# Patient Record
Sex: Male | Born: 1965
Health system: Southern US, Community
[De-identification: ages and names within clinical notes are randomized; demographics above are authoritative.]

## PROBLEM LIST (undated history)

## (undated) DIAGNOSIS — E785 Hyperlipidemia, unspecified: Secondary | ICD-10-CM

## (undated) HISTORY — DX: Hyperlipidemia, unspecified: E78.5

## (undated) HISTORY — PX: HERNIA REPAIR: SHX51

## (undated) HISTORY — PX: OTHER SURGICAL HISTORY: SHX169

## (undated) HISTORY — PX: COLONOSCOPY: SHX174

---

## 2005-05-01 ENCOUNTER — Ambulatory Visit: Payer: Self-pay | Admitting: Cardiology

## 2005-05-23 ENCOUNTER — Ambulatory Visit (HOSPITAL_COMMUNITY): Admission: RE | Admit: 2005-05-23 | Discharge: 2005-05-23 | Payer: Self-pay | Admitting: Gastroenterology

## 2008-05-17 ENCOUNTER — Ambulatory Visit (HOSPITAL_BASED_OUTPATIENT_CLINIC_OR_DEPARTMENT_OTHER): Admission: RE | Admit: 2008-05-17 | Discharge: 2008-05-17 | Payer: Self-pay | Admitting: General Surgery

## 2010-11-26 NOTE — Op Note (Signed)
NAME:  [name], [name]              ACCOUNT NO.:  [id number]   MEDICAL RECORD NO.:  [id number]          PATIENT TYPE:  AMB   LOCATION:  DSC                          FACILITY:  [facility]   PHYSICIAN:  [name], MDDATE OF BIRTH:  22-Aug-1965   DATE OF PROCEDURE:  05/17/2008  DATE OF DISCHARGE:                               OPERATIVE REPORT   PREOPERATIVE DIAGNOSIS:  Left inguinal hernia.   POSTOPERATIVE DIAGNOSIS:  Pantaloon left inguinal hernia.   PROCEDURE:  Left inguinal hernia repair with large Prolene Hernia System  mesh.   SURGEON:  Lucas Gosling, MD.   ASSISTANT:  None.   ANESTHESIA:  General.   FINDINGS:  Pantaloon left inguinal hernia.   SPECIMENS:  None.   DRAIN:  None.   COMPLICATIONS:  None.   ESTIMATED BLOOD LOSS:  Minimal.   DISPOSITION:  To PACU in stable condition.   HISTORY:  [name] is a 45 year old male who with a several year history of  a left groin bulge that had been increasingly enlarging in size and  becoming symptomatic.  Over the last several months, it began difficult  for him to reduce this as well.  On exam, he has a reducible left groin  hernia.  He has a prior history of right orchiectomy for a benign  testicular nodule.   PROCEDURE:  After informed consent was obtained, the patient was taken  to the operating room.  He was administered 1 g of intravenous Ancef.  Sequential compression devices were placed on the lower extremities,  underwent  general anesthesia without complication.  His left groin was  then prepped and draped in the standard sterile surgical fashion.  Surgical time-out was then performed.  A 1.5-cm left groin incision was  then made.  Dissection was carried out down to the level of the external  abdominal oblique.  The superficial epigastric vein was identified, and  this was ligated with 3-0 Vicryl suture.  The external abdominal oblique  was then entered through the external ring.  The spermatic cord was  then  encircled with a Penrose drain, preserving this throughout the entire  procedure.  He was noted to have a large chronic left indirect inguinal  hernia sac, which was dissected free from the spermatic cord structures.  He was also noted to have a direct hernia, and this was dissected free  from the surrounding tissues as well as from the epigastric vessels.  The direct hernia sac  was excised and tied off with a silk suture.  The  entire hernia was then reduced.  The preperitoneal space was then  developed with a sponge.  A large Prolene Hernia System mesh was then  inserted into the space overlying Coopers ligament.  The bottom portion  of the bilayer was completely laid out.  I then closed the floor with 2-  0 Vicryl over this.  The top portion of the bilayer was then laid flat  laterally.  Underneath the external abdominal oblique, a T-cut was made  and wrapped around the spermatic cord.  This was sutured in position  near the pubic  tubercle, sutured into the internal oblique superiorly,  sutured medially to the inguinal ligament and then the cut portions of  the mesh were wrapped around the spermatic cord, and then tacked  together as well as to the inguinal ligament with a 2-0 Prolene suture.  The mesh appeared to be  lying good position upon completion of this.  Hemostasis was observed.  The external abdominal oblique was closed with  a 2-0 Vicryl, Scarpa with a 3-0 Vicryl, and the skin was closed with 4-0  in a subcuticular suture.  Dermabond was then placed over his incision.  I then infiltrated 10 mL of 0.25% Marcaine around his wound as well as  performed an ilioinguinal nerve block.  Upon completion, he was  extubated in the operating room and transferred to the recovery room in  stable condition.      Lucas Gosling, MD  Electronically Signed     [facility]/MEDQ  D:  05/17/2008  T:  05/18/2008  Job:  [id number]

## 2010-11-29 NOTE — Op Note (Signed)
NAME:  [name], [name]              ACCOUNT NO.:  [id number]   MEDICAL RECORD NO.:  [id number]          PATIENT TYPE:  AMB   LOCATION:  ENDO                         FACILITY:  [facility]   PHYSICIAN:  John C. Madilyn Fireman, M.D.    DATE OF BIRTH:  1966/01/13   DATE OF PROCEDURE:  05/23/2005  DATE OF DISCHARGE:                                 OPERATIVE REPORT   OPERATION:  Colonoscopy.   INDICATIONS FOR PROCEDURE:  Heme positive stool.   PROCEDURE:  The patient was placed in the left lateral decubitus position  and placed on the pulse monitor with continuous low flow oxygen delivered by  nasal cannula.  He was sedated with 75 mcg IV Fentanyl and 7.5 mg IV Versed.  The Olympus video colonoscope was inserted into the rectum and advanced to  the cecum, confirmed by transillumination of McBurney's point and  visualization of the ileocecal valve and appendiceal orifice.  The prep was  good.  The cecum, ascending, transverse, descending, sigmoid, and rectum all  appeared normal with no masses, polyps, diverticula or other mucosal  abnormalities.  The rectum, likewise, appeared normal.  Retroflex view of  the anus revealed no obvious internal hemorrhoids.  The scope was then  withdrawn and the patient returned to the recovery room in stable condition.  He tolerated the procedure well and there were no immediate complications.   IMPRESSION:  Normal colonoscopy.   PLAN:  Repeat colonoscopy at age 45.           ______________________________  Everardo All. Madilyn Fireman, M.D.     [facility]/MEDQ  D:  05/23/2005  T:  05/23/2005  Job:  [id number]   cc:   Ernestina Penna, M.D.  Fax: [phone]

## 2011-04-15 LAB — CBC
Hemoglobin: 16.1
RBC: 5.1
WBC: 5

## 2011-04-15 LAB — DIFFERENTIAL
Lymphocytes Relative: 43
Lymphs Abs: 2.2
Monocytes Absolute: 0.4
Monocytes Relative: 9
Neutro Abs: 2.2
Neutrophils Relative %: 44

## 2011-07-17 ENCOUNTER — Encounter: Payer: Self-pay | Admitting: Emergency Medicine

## 2011-07-17 ENCOUNTER — Emergency Department (INDEPENDENT_AMBULATORY_CARE_PROVIDER_SITE_OTHER)
Admission: EM | Admit: 2011-07-17 | Discharge: 2011-07-17 | Disposition: A | Discharge status: Home / Self Care | Payer: Self-pay | Source: Home / Self Care | Attending: Family Medicine | Admitting: Family Medicine

## 2011-07-17 DIAGNOSIS — S139XXA Sprain of joints and ligaments of unspecified parts of neck, initial encounter: Secondary | ICD-10-CM

## 2011-07-17 DIAGNOSIS — S161XXA Strain of muscle, fascia and tendon at neck level, initial encounter: Secondary | ICD-10-CM

## 2011-07-17 DIAGNOSIS — M549 Dorsalgia, unspecified: Secondary | ICD-10-CM

## 2011-07-17 MED ORDER — CYCLOBENZAPRINE HCL 5 MG PO TABS: 5.0000 mg | ORAL_TABLET | Freq: Three times a day (TID) | ORAL | Status: AC | PRN
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
## 2011-11-18 ENCOUNTER — Other Ambulatory Visit: Payer: Self-pay | Admitting: Family Medicine

## 2011-11-18 DIAGNOSIS — M549 Dorsalgia, unspecified: Secondary | ICD-10-CM

## 2011-11-18 DIAGNOSIS — M542 Cervicalgia: Secondary | ICD-10-CM

## 2011-11-21 ENCOUNTER — Other Ambulatory Visit (HOSPITAL_COMMUNITY): Payer: PRIVATE HEALTH INSURANCE

## 2011-12-03 ENCOUNTER — Ambulatory Visit (INDEPENDENT_AMBULATORY_CARE_PROVIDER_SITE_OTHER): Payer: 59 | Admitting: Sports Medicine

## 2011-12-03 ENCOUNTER — Encounter: Payer: Self-pay | Admitting: Sports Medicine

## 2011-12-03 VITALS — BP 160/98 | HR 67 | Ht 69.0 in | Wt 170.0 lb

## 2011-12-03 DIAGNOSIS — M7741 Metatarsalgia, right foot: Secondary | ICD-10-CM | POA: Insufficient documentation

## 2011-12-03 DIAGNOSIS — M7750 Other enthesopathy of unspecified foot: Secondary | ICD-10-CM

## 2011-12-03 NOTE — Assessment & Plan Note (Addendum)
Plan to have patient wear inserts in his shoes. Will start with green insole with some hapad pads for the metatarsal area  Try different size MT pads and if one works well we will continue with this  Reck BP at PCP offcie

## 2011-12-03 NOTE — Progress Notes (Signed)
Patient ID: [name], male   DOB: 08-30-1965, 46 y.o.   MRN: [id number] History of present illness: Patient presents today for evaluation of right foot pain has been going on for 2 years. He says that 2 years ago he changed running shoes from ascics to nikes. He noted pain soon after that but kept running on the shoes. 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 He has not been able to resolve and on its own and he is worried he is a stress fracture.  Exam- Foot-the right foot has a transverse arch collapse Loss of longitudinal arch Forefoot widening Lowering of the navicular bone Fourth metatarsal has some tenderness and is lower Fifth metatarsal is subluxed bilaterally Bilateral bunionette present No swelling No TTP over dorsum of MT  Running gait without limp and neg hop

## 2012-05-21 ENCOUNTER — Ambulatory Visit: Payer: 59

## 2012-06-18 ENCOUNTER — Ambulatory Visit (HOSPITAL_COMMUNITY)
Admission: RE | Admit: 2012-06-18 | Discharge: 2012-06-18 | Disposition: A | Discharge status: Home / Self Care | Payer: No Typology Code available for payment source | Source: Ambulatory Visit | Attending: Family Medicine | Admitting: Family Medicine

## 2012-06-18 DIAGNOSIS — M542 Cervicalgia: Secondary | ICD-10-CM

## 2012-06-18 DIAGNOSIS — M549 Dorsalgia, unspecified: Secondary | ICD-10-CM

## 2012-06-18 DIAGNOSIS — M47812 Spondylosis without myelopathy or radiculopathy, cervical region: Secondary | ICD-10-CM | POA: Insufficient documentation

## 2012-07-09 ENCOUNTER — Ambulatory Visit: Payer: 59 | Attending: Family Medicine

## 2012-07-09 DIAGNOSIS — M2569 Stiffness of other specified joint, not elsewhere classified: Secondary | ICD-10-CM | POA: Insufficient documentation

## 2012-07-09 DIAGNOSIS — M542 Cervicalgia: Secondary | ICD-10-CM | POA: Insufficient documentation

## 2012-07-09 DIAGNOSIS — IMO0001 Reserved for inherently not codable concepts without codable children: Secondary | ICD-10-CM | POA: Insufficient documentation

## 2012-07-09 DIAGNOSIS — M545 Low back pain, unspecified: Secondary | ICD-10-CM | POA: Insufficient documentation

## 2012-07-09 DIAGNOSIS — R5381 Other malaise: Secondary | ICD-10-CM | POA: Insufficient documentation

## 2012-07-09 DIAGNOSIS — M79609 Pain in unspecified limb: Secondary | ICD-10-CM | POA: Insufficient documentation

## 2012-07-12 ENCOUNTER — Ambulatory Visit: Payer: 59 | Admitting: Physical Therapy

## 2012-07-20 ENCOUNTER — Ambulatory Visit: Payer: No Typology Code available for payment source | Attending: Family Medicine | Admitting: Physical Therapy

## 2012-07-20 DIAGNOSIS — M545 Low back pain, unspecified: Secondary | ICD-10-CM | POA: Insufficient documentation

## 2012-07-20 DIAGNOSIS — M2569 Stiffness of other specified joint, not elsewhere classified: Secondary | ICD-10-CM | POA: Insufficient documentation

## 2012-07-20 DIAGNOSIS — IMO0001 Reserved for inherently not codable concepts without codable children: Secondary | ICD-10-CM | POA: Insufficient documentation

## 2012-07-20 DIAGNOSIS — M79609 Pain in unspecified limb: Secondary | ICD-10-CM | POA: Insufficient documentation

## 2012-07-20 DIAGNOSIS — M542 Cervicalgia: Secondary | ICD-10-CM | POA: Insufficient documentation

## 2012-07-20 DIAGNOSIS — R5381 Other malaise: Secondary | ICD-10-CM | POA: Insufficient documentation

## 2012-07-29 ENCOUNTER — Ambulatory Visit: Payer: No Typology Code available for payment source | Admitting: Physical Therapy

## 2012-08-04 ENCOUNTER — Ambulatory Visit: Payer: No Typology Code available for payment source | Admitting: Physical Therapy

## 2012-09-13 ENCOUNTER — Encounter: Payer: 59 | Admitting: Physical Therapy

## 2012-12-09 ENCOUNTER — Encounter: Payer: Self-pay | Admitting: Family Medicine

## 2012-12-09 ENCOUNTER — Other Ambulatory Visit (INDEPENDENT_AMBULATORY_CARE_PROVIDER_SITE_OTHER): Payer: 59

## 2012-12-09 ENCOUNTER — Other Ambulatory Visit: Payer: Self-pay | Admitting: *Deleted

## 2012-12-09 DIAGNOSIS — Z139 Encounter for screening, unspecified: Secondary | ICD-10-CM

## 2012-12-09 DIAGNOSIS — E785 Hyperlipidemia, unspecified: Secondary | ICD-10-CM

## 2012-12-09 DIAGNOSIS — R002 Palpitations: Secondary | ICD-10-CM

## 2012-12-09 LAB — HEPATIC FUNCTION PANEL
AST: 29 U/L (ref 0–37)
Albumin: 4.8 g/dL (ref 3.5–5.2)
Alkaline Phosphatase: 65 U/L (ref 39–117)
Indirect Bilirubin: 0.4 mg/dL (ref 0.0–0.9)
Total Protein: 7 g/dL (ref 6.0–8.3)

## 2012-12-09 LAB — POCT CBC
HCT, POC: 46.1 % (ref 43.5–53.7)
Hemoglobin: 16.1 g/dL (ref 14.1–18.1)
MCH, POC: 32 pg — AB (ref 27–31.2)
MCHC: 34.9 g/dL (ref 31.8–35.4)
POC Granulocyte: 4.3 (ref 2–6.9)
RDW, POC: 13.2 %
WBC: 6.6 10*3/uL (ref 4.6–10.2)

## 2012-12-09 NOTE — Patient Instructions (Signed)
Arrange stress test with cardiologist Routine labs today

## 2012-12-09 NOTE — Progress Notes (Signed)
  Subjective:    Patient ID: [name], male    DOB: 1965/07/22, 47 y.o.   MRN: [id number]  HPI The office manager at the rehabilitation center called and indicated that Lucas was having some chest discomfort and irregular heart. I went over to see him because he refused to come to our office. He indicated that he had been under a lot of stress recently and that his heart had been beating irregularly. He said that this was not anything new. This did happen at times in the past.      Review of Systems  Constitutional: Negative.   Cardiovascular:       Chest discomfort and palpitation         Objective:   Physical Exam  Constitutional: He is oriented to person, place, and time. He appears well-developed and well-nourished. No distress.  HENT:  Head: Normocephalic.  Eyes: Conjunctivae are normal. Right eye exhibits no discharge. Left eye exhibits no discharge.  Neck: Normal range of motion. Neck supple. No thyromegaly present.  Cardiovascular: Normal rate and normal heart sounds.  Exam reveals no gallop and no friction rub.   No murmur heard. Irregular rhythm with frequent PVC at about 72 per minute  Pulmonary/Chest: Effort normal and breath sounds normal. He has no wheezes. He has no rales.  Abdominal: Soft. He exhibits no mass. There is no tenderness. There is no rebound and no guarding.  Musculoskeletal: Normal range of motion.  Lymphadenopathy:    He has no cervical adenopathy.  Neurological: He is alert and oriented to person, place, and time.  Skin: Skin is warm and dry.  Psychiatric: He has a normal mood and affect. His behavior is normal. Judgment and thought content normal.    EKG was done, which showed some peaked T waves in V1 through V4      Assessment & Plan:    PVCs on auscultation, EKG sinus rhythm    Plan: Schedule stress test with Dr. Antoine Poche          Get routine labs with thyroid panel          Decrease caffeine intake

## 2012-12-09 NOTE — Addendum Note (Signed)
Addended by: Bearl Mulberry on: 12/09/2012 05:48 PM   Modules accepted: Orders

## 2012-12-10 LAB — THYROID PANEL WITH TSH
Free Thyroxine Index: 2.9 (ref 1.0–3.9)
T4, Total: 8.1 ug/dL (ref 5.0–12.5)
TSH: 0.979 u[IU]/mL (ref 0.350–4.500)

## 2012-12-10 LAB — BASIC METABOLIC PANEL WITH GFR
BUN: 12 mg/dL (ref 6–23)
CO2: 27 mEq/L (ref 19–32)
Chloride: 101 mEq/L (ref 96–112)
Creat: 1.08 mg/dL (ref 0.50–1.35)
Potassium: 4.4 mEq/L (ref 3.5–5.3)

## 2012-12-10 LAB — NMR LIPOPROFILE WITH LIPIDS
HDL Particle Number: 40.3 umol/L (ref >=30.5)
HDL Size: 9.4 nm (ref >=9.2)
HDL-C: 71 mg/dL (ref >=40)
LDL Size: 21.1 nm (ref >20.5)
Large HDL-P: 10 umol/L (ref >=4.8)
Large VLDL-P: 0.9 nmol/L (ref <=2.7)
Triglycerides: 69 mg/dL (ref <150)

## 2012-12-10 LAB — PSA: PSA: 0.92 ng/mL (ref <=4.00)

## 2012-12-15 ENCOUNTER — Telehealth: Payer: Self-pay | Admitting: Cardiology

## 2012-12-15 NOTE — Telephone Encounter (Signed)
[facility] attempted to call office number but no answer. Called pt on cell phone

## 2012-12-15 NOTE — Telephone Encounter (Signed)
New Prob     Pt calling to returning Dr. Lindaann Slough call from Monday. Please call.

## 2012-12-15 NOTE — Telephone Encounter (Signed)
Dr Hochrein spoke with pt 

## 2012-12-15 NOTE — Telephone Encounter (Signed)
Will have MD call

## 2012-12-16 ENCOUNTER — Telehealth: Payer: Self-pay | Admitting: *Deleted

## 2012-12-16 NOTE — Telephone Encounter (Signed)
Pt notified of lab results

## 2012-12-16 NOTE — Telephone Encounter (Signed)
Message copied by Bearl Mulberry on Thu Dec 16, 2012  4:43 PM ------      Message from: Ernestina Penna      Created: Sat Dec 11, 2012 12:48 AM       The BMP had a normal electrolytes and renal function tests. The blood sugar was elevated at 114. He was not however fasting when this was done.      All liver function tests were within normal limits.      The total LDL particle number on advanced lipid testing was elevated at 1507, this will need to be compared to his previous readings this past fall++++++++, the triglycerides were good. The HDL P. was excellent. LDL size was large. Treatment will be determined after reviewing previous advanced lipid panel testing from paper chart. Please compare this.       PSA is low and good.       thyroid is within normal limits appear ------

## 2012-12-24 ENCOUNTER — Encounter: Payer: Self-pay | Admitting: Family Medicine

## 2013-01-05 ENCOUNTER — Ambulatory Visit (INDEPENDENT_AMBULATORY_CARE_PROVIDER_SITE_OTHER): Payer: 59 | Admitting: Cardiology

## 2013-01-05 ENCOUNTER — Encounter: Payer: Self-pay | Admitting: Cardiology

## 2013-01-05 VITALS — BP 140/86 | HR 77 | Ht 69.0 in | Wt 173.0 lb

## 2013-01-05 DIAGNOSIS — R002 Palpitations: Secondary | ICD-10-CM

## 2013-01-05 NOTE — Progress Notes (Signed)
HPI [name] presents for evaluation of palpitations. He is having these in the past. He was having them for only a few weeks ago. However, he would probably under increased stress. He hasn't really taken any vacation time in quite a while. He was feeling palpitations that were occurring daily and sporadically but fairly persistent. He would feel it pounding in his chest. He would feel somewhat fatigued or lightheaded with this but did not have any syncope.  We talked about this over the phone. He subsequently went on vacation relieves some of his stress and started exercising again. He actually thinks that exercising with running "reset" his heart.  He has since going on vacation had no further tachypalpitations. He's not describing chest pressure, neck or arm discomfort. He's not having a new shortness of breath PND or orthopnea  No Known Allergies  Current Outpatient Prescriptions  Medication Sig Dispense Refill  . B Complex Vitamins (VITAMIN B COMPLEX PO) Take by mouth daily.      Marland Kitchen co-enzyme Q-10 30 MG capsule Take 30 mg by mouth daily.      . fish oil-omega-3 fatty acids 1000 MG capsule Take 1 g by mouth daily.      Marland Kitchen ibuprofen (ADVIL,MOTRIN) 200 MG tablet Take 200 mg by mouth every 6 (six) hours as needed.      . Multiple Vitamin (MULTIVITAMINS PO) Take by mouth daily.       No current facility-administered medications for this visit.    No past medical history on file.  Past Surgical History  Procedure Laterality Date  . Hernia repair      No family history on file.  History   Social History  . Marital Status: Married    Spouse Name: N/A    Number of Children: N/A  . Years of Education: N/A   Occupational History  . Not on file.   Social History Main Topics  . Smoking status: Never Smoker   . Smokeless tobacco: Never Used  . Alcohol Use: No  . Drug Use: No  . Sexually Active: Not on file   Other Topics Concern  . Not on file   Social History Narrative  . No  narrative on file    ROS:  Positive for urinary frequency, mild lumbar pain. Otherwise as stated in the history of present illness and negative for all systems are  PHYSICAL EXAM BP 140/86  Pulse 77  Ht 5\' 9"  (1.753 m)  Wt 173 lb (78.472 kg)  BMI 25.54 kg/m2 GENERAL:  Well appearing HEENT:  Pupils equal round and reactive, fundi not visualized, oral mucosa unremarkable NECK:  No jugular venous distention, waveform within normal limits, carotid upstroke brisk and symmetric, no bruits, no thyromegaly LYMPHATICS:  No cervical, inguinal adenopathy LUNGS:  Clear to auscultation bilaterally BACK:  No CVA tenderness CHEST:  Unremarkable HEART:  PMI not displaced or sustained,S1 and S2 within normal limits, no S3, no S4, no clicks, no rubs, no murmurs ABD:  Flat, positive bowel sounds normal in frequency in pitch, no bruits, no rebound, no guarding, no midline pulsatile mass, no hepatomegaly, no splenomegaly EXT:  2 plus pulses throughout, no edema, no cyanosis no clubbing SKIN:  No rashes no nodules NEURO:  Cranial nerves II through XII grossly intact, motor grossly intact throughout PSYCH:  Cognitively intact, oriented to person place and time   EKG:  Sinus rhythm, rate 67, axis within normal limits, intervals within normal limits, premature ectopic complexes, no acute ST-T wave changes.  ASSESSMENT AND PLAN  Palpitations:  These seem to have resolved. We talked about when necessary beta blocker dosing. However, he doesn't think he needs this. Certainly he does better with physical exercise. Therefore, at this point no further evaluation or change in therapy is indicated. We talked about her continued exercise regimen and this is certainly an area of expertise for him and he understands and will try to be more consistent.

## 2013-03-04 ENCOUNTER — Encounter: Payer: 59 | Admitting: Cardiology

## 2013-03-23 ENCOUNTER — Other Ambulatory Visit: Payer: Self-pay | Admitting: *Deleted

## 2013-03-23 ENCOUNTER — Ambulatory Visit (INDEPENDENT_AMBULATORY_CARE_PROVIDER_SITE_OTHER): Payer: 59 | Admitting: *Deleted

## 2013-03-23 DIAGNOSIS — R002 Palpitations: Secondary | ICD-10-CM

## 2013-03-23 NOTE — Progress Notes (Signed)
Patient ID: [name], male   DOB: Nov 14, 1965, 47 y.o.   MRN: [id number] HOLTER MONITOR PUT ON PT BY KRISTEN HUDY.

## 2013-03-24 ENCOUNTER — Other Ambulatory Visit: Payer: Self-pay | Admitting: *Deleted

## 2013-03-24 DIAGNOSIS — R002 Palpitations: Secondary | ICD-10-CM

## 2013-03-24 NOTE — Progress Notes (Signed)
24 hr holter monitor removed and downloaded and sent to [facility].

## 2013-03-30 NOTE — Progress Notes (Signed)
Heart monitor downloaded and results sent to [facility]  On 03/24/13. Results obtained on 9/16 and reviewed by dr Christell Constant. Results scanned into epic and sent to Dr. Antoine Poche for review. Copy of results given to patient and he will discuss his symptoms with Dr. Antoine Poche when he is at the [facility] office.

## 2014-01-19 ENCOUNTER — Telehealth: Payer: Self-pay | Admitting: Family Medicine

## 2014-01-19 MED ORDER — AZITHROMYCIN 250 MG PO TABS: ORAL_TABLET | ORAL | Status: DC

## 2014-01-19 NOTE — Telephone Encounter (Signed)
zpak sent to pharmacy 

## 2014-01-19 NOTE — Telephone Encounter (Signed)
He is still having congestion you said if it continued you would call in a zpack he uses [employer]

## 2014-07-05 ENCOUNTER — Other Ambulatory Visit: Payer: Self-pay | Admitting: Family Medicine

## 2014-07-05 ENCOUNTER — Ambulatory Visit: Payer: 59

## 2014-07-05 ENCOUNTER — Ambulatory Visit (INDEPENDENT_AMBULATORY_CARE_PROVIDER_SITE_OTHER): Payer: 59

## 2014-07-05 ENCOUNTER — Ambulatory Visit (INDEPENDENT_AMBULATORY_CARE_PROVIDER_SITE_OTHER): Payer: 59 | Admitting: Family Medicine

## 2014-07-05 ENCOUNTER — Encounter: Payer: Self-pay | Admitting: Family Medicine

## 2014-07-05 VITALS — BP 140/88 | HR 97 | Temp 97.4°F | Ht 69.0 in | Wt 166.0 lb

## 2014-07-05 DIAGNOSIS — R911 Solitary pulmonary nodule: Secondary | ICD-10-CM

## 2014-07-05 DIAGNOSIS — I499 Cardiac arrhythmia, unspecified: Secondary | ICD-10-CM

## 2014-07-05 DIAGNOSIS — R059 Cough, unspecified: Secondary | ICD-10-CM

## 2014-07-05 DIAGNOSIS — R05 Cough: Secondary | ICD-10-CM

## 2014-07-05 DIAGNOSIS — J209 Acute bronchitis, unspecified: Secondary | ICD-10-CM

## 2014-07-05 LAB — POCT CBC
Granulocyte percent: 83.5 %G — AB (ref 37–80)
HCT, POC: 46.3 % (ref 43.5–53.7)
Hemoglobin: 15.3 g/dL (ref 14.1–18.1)
LYMPH, POC: 1.1 (ref 0.6–3.4)
MCH: 30.7 pg (ref 27–31.2)
MCHC: 33 g/dL (ref 31.8–35.4)
MCV: 93.1 fL (ref 80–97)
MPV: 6.7 fL (ref 0–99.8)
PLATELET COUNT, POC: 239 10*3/uL (ref 142–424)
POC Granulocyte: 8.3 — AB (ref 2–6.9)
POC LYMPH %: 11.3 % (ref 10–50)
RBC: 5 M/uL (ref 4.69–6.13)
RDW, POC: 12.7 %
WBC: 9.9 10*3/uL (ref 4.6–10.2)

## 2014-07-05 MED ORDER — AZITHROMYCIN 250 MG PO TABS: ORAL_TABLET | ORAL | Status: DC

## 2014-07-05 NOTE — Progress Notes (Signed)
Subjective:    Patient ID: [name], male    DOB: 23-Feb-1966, 48 y.o.   MRN: [id number]  HPI Patient here today for chest tightness, cough and congestion that started several days ago. It is important to note that the patient's son does have pneumonia. The patient's blood pressure is also elevated today and we will request that he bring readings back for review and 4 weeks. We'll also request that he watches his sodium intake better.       Patient Active Problem List   Diagnosis Date Noted  . Palpitations 01/05/2013  . Metatarsalgia, right 12/03/2011   Outpatient Encounter Prescriptions as of 07/05/2014  Medication Sig  . B Complex Vitamins (VITAMIN B COMPLEX PO) Take by mouth daily.  Marland Kitchen co-enzyme Q-10 30 MG capsule Take 30 mg by mouth daily.  . fish oil-omega-3 fatty acids 1000 MG capsule Take 1 g by mouth daily.  Marland Kitchen ibuprofen (ADVIL,MOTRIN) 200 MG tablet Take 200 mg by mouth every 6 (six) hours as needed.  . Multiple Vitamin (MULTIVITAMINS PO) Take by mouth daily.  . [DISCONTINUED] azithromycin (ZITHROMAX Z-PAK) 250 MG tablet As directed    Review of Systems  Constitutional: Negative.   HENT: Positive for congestion.   Eyes: Negative.   Respiratory: Positive for cough and chest tightness.   Cardiovascular: Negative.   Gastrointestinal: Negative.   Endocrine: Negative.   Genitourinary: Negative.   Musculoskeletal: Negative.   Skin: Negative.   Allergic/Immunologic: Negative.   Neurological: Negative.   Hematological: Negative.   Psychiatric/Behavioral: Negative.        Objective:   Physical Exam  Constitutional: He is oriented to person, place, and time. He appears well-developed and well-nourished. No distress.  Patient had caffeine this morning and says he's under a lot of stress. He is somewhat clammy and warm to touch.  HENT:  Head: Normocephalic and atraumatic.  Right Ear: External ear normal.  Left Ear: External ear normal.  Mouth/Throat: Oropharynx  is clear and moist. No oropharyngeal exudate.  Nasal congestion bilaterally with turbinate irritation on the right  Eyes: Conjunctivae and EOM are normal. Pupils are equal, round, and reactive to light. Right eye exhibits no discharge. Left eye exhibits no discharge. No scleral icterus.  Neck: Normal range of motion. Neck supple. No thyromegaly present.  No anterior cervical nodes or thyromegaly  Cardiovascular: Normal rate, regular rhythm and normal heart sounds.  Exam reveals no friction rub.   No murmur heard. The heart is irregular at 72-84/m.  Pulmonary/Chest: Effort normal and breath sounds normal. No respiratory distress. He has no wheezes. He has no rales. He exhibits no tenderness.  Breath sounds were good bilaterally with a dry cough. There were no rhonchi rales or wheezes.  Abdominal: Soft. Bowel sounds are normal. There is no tenderness. There is no rebound.  Musculoskeletal: Normal range of motion. He exhibits no edema.  Lymphadenopathy:    He has no cervical adenopathy.  Neurological: He is alert and oriented to person, place, and time.  Skin: Skin is warm and dry. No rash noted.  Psychiatric: He has a normal mood and affect. His behavior is normal. Judgment and thought content normal.  Nursing note and vitals reviewed.  On reviewing a previous EKG the patient had PACs.   BP 157/95 mmHg  Pulse 97  Temp(Src) 97.4 F (36.3 C) (Oral)  Ht 5\' 9"  (1.753 m)  Wt 166 lb (75.297 kg)  BMI 24.50 kg/m2  [facility] reading (PRIMARY) by  Dr. Brunilda Payor x-ray--no  active disease  EKG: PACs with a rate of 72-75    Results for orders placed or performed in visit on 07/05/14  POCT CBC  Result Value Ref Range   WBC 9.9 4.6 - 10.2 K/uL   Lymph, poc 1.1 0.6 - 3.4   POC LYMPH PERCENT 11.3 10 - 50 %L   POC Granulocyte 8.3 (A) 2 - 6.9   Granulocyte percent 83.5 (A) 37 - 80 %G   RBC 5.0 4.69 - 6.13 M/uL   Hemoglobin 15.3 14.1 - 18.1 g/dL   HCT, POC 46.3 43.5 - 53.7 %   MCV 93.1 80 - 97 fL     MCH, POC 30.7 27 - 31.2 pg   MCHC 33.0 31.8 - 35.4 g/dL   RDW, POC 12.7 %   Platelet Count, POC 239.0 142 - 424 K/uL   MPV 6.7 0 - 99.8 fL                                         Assessment & Plan:  1. Cough - POCT CBC - DG Chest 2 View; Future - azithromycin (ZITHROMAX) 250 MG tablet; Two the first day then 1 daily for infection until completed  Dispense: 6 tablet; Refill: 0  2. Acute bronchitis, unspecified organism - azithromycin (ZITHROMAX) 250 MG tablet; Two the first day then 1 daily for infection until completed  Dispense: 6 tablet; Refill: 0  3. Irregular heart rhythm - EKG 12-Lead  Patient Instructions  Take Tylenol as needed for aches pains and fever Drink plenty of fluids Take Mucinex maximum strength plain, blue and white in color, 1 twice daily with a large glass of water Use saline nose spray frequently during the day Take antibiotic as directed Watch caffeine intake and sodium intake Review blood pressures in 4 weeks   Arrie Senate MD

## 2014-07-05 NOTE — Patient Instructions (Signed)
Take Tylenol as needed for aches pains and fever Drink plenty of fluids Take Mucinex maximum strength plain, blue and white in color, 1 twice daily with a large glass of water Use saline nose spray frequently during the day Take antibiotic as directed Watch caffeine intake and sodium intake Review blood pressures in 4 weeks

## 2015-02-14 ENCOUNTER — Ambulatory Visit (INDEPENDENT_AMBULATORY_CARE_PROVIDER_SITE_OTHER): Payer: 59 | Admitting: Family Medicine

## 2015-02-14 ENCOUNTER — Encounter (INDEPENDENT_AMBULATORY_CARE_PROVIDER_SITE_OTHER): Payer: Self-pay

## 2015-02-14 ENCOUNTER — Encounter: Payer: Self-pay | Admitting: Family Medicine

## 2015-02-14 VITALS — BP 138/83 | HR 61 | Temp 98.1°F | Ht 69.0 in | Wt 165.8 lb

## 2015-02-14 DIAGNOSIS — R05 Cough: Secondary | ICD-10-CM

## 2015-02-14 DIAGNOSIS — R059 Cough, unspecified: Secondary | ICD-10-CM

## 2015-02-14 DIAGNOSIS — N400 Enlarged prostate without lower urinary tract symptoms: Secondary | ICD-10-CM

## 2015-02-14 DIAGNOSIS — R002 Palpitations: Secondary | ICD-10-CM

## 2015-02-14 DIAGNOSIS — Z1211 Encounter for screening for malignant neoplasm of colon: Secondary | ICD-10-CM

## 2015-02-14 DIAGNOSIS — Z0000 Encounter for general adult medical examination without abnormal findings: Secondary | ICD-10-CM

## 2015-02-14 LAB — POCT UA - MICROSCOPIC ONLY
Bacteria, U Microscopic: NEGATIVE
Casts, Ur, LPF, POC: NEGATIVE
Crystals, Ur, HPF, POC: NEGATIVE
Epithelial cells, urine per micros: NEGATIVE
RBC, urine, microscopic: NEGATIVE
Yeast, UA: NEGATIVE

## 2015-02-14 LAB — POCT URINALYSIS DIPSTICK
Bilirubin, UA: NEGATIVE
Glucose, UA: NEGATIVE
Ketones, UA: NEGATIVE
Leukocytes, UA: NEGATIVE
Nitrite, UA: NEGATIVE
PH UA: 7.5
PROTEIN UA: NEGATIVE
RBC UA: NEGATIVE
SPEC GRAV UA: 1.015
Urobilinogen, UA: NEGATIVE

## 2015-02-14 LAB — POCT CBC
Granulocyte percent: 57 %G (ref 37–80)
HCT, POC: 46.5 % (ref 43.5–53.7)
Hemoglobin: 15.3 g/dL (ref 14.1–18.1)
Lymph, poc: 2.7 (ref 0.6–3.4)
MCH, POC: 29.7 pg (ref 27–31.2)
MCHC: 32.8 g/dL (ref 31.8–35.4)
MCV: 90.5 fL (ref 80–97)
MPV: 7.4 fL (ref 0–99.8)
POC Granulocyte: 3.9 (ref 2–6.9)
POC LYMPH PERCENT: 39.2 %L (ref 10–50)
Platelet Count, POC: 251 10*3/uL (ref 142–424)
RBC: 5.14 M/uL (ref 4.69–6.13)
RDW, POC: 12.6 %
WBC: 6.8 10*3/uL (ref 4.6–10.2)

## 2015-02-14 NOTE — Addendum Note (Signed)
Addended by: Zannie Cove on: 02/14/2015 04:07 PM   Modules accepted: Orders

## 2015-02-14 NOTE — Patient Instructions (Signed)
The patient should continue to follow his diet regularly He should exercise regularly He should D stress as much as possible and reduce the caffeine intake as much as possible He should drink plenty of fluids daily and avoid diet beverages He should check with his insurance regarding the Prevnar vaccine and the shingles shot We will arrange for him to have a routine colonoscopy He should return the FOBT We will call with the lab work results as soon as it becomes available Take Mucinex maximum strength, blue and white in color, 1 twice daily for cough and congestion with a large glass of water Use nasal saline regularly

## 2015-02-14 NOTE — Progress Notes (Signed)
Subjective:    Patient ID: [name], male    DOB: 03-12-1966, 49 y.o.   MRN: [id number]  HPI Patient is here today for annual wellness exam and follow up of chronic medical problems. He is doing well except she does complain of some chest congestion and cough.       Patient Active Problem List   Diagnosis Date Noted  . Palpitations 01/05/2013  . Metatarsalgia, right 12/03/2011   Outpatient Encounter Prescriptions as of 02/14/2015  Medication Sig  . Multiple Vitamin (MULTIVITAMINS PO) Take by mouth daily.  . [DISCONTINUED] azithromycin (ZITHROMAX) 250 MG tablet Two the first day then 1 daily for infection until completed  . [DISCONTINUED] B Complex Vitamins (VITAMIN B COMPLEX PO) Take by mouth daily.  . [DISCONTINUED] co-enzyme Q-10 30 MG capsule Take 30 mg by mouth daily.  . [DISCONTINUED] fish oil-omega-3 fatty acids 1000 MG capsule Take 1 g by mouth daily.  . [DISCONTINUED] ibuprofen (ADVIL,MOTRIN) 200 MG tablet Take 200 mg by mouth every 6 (six) hours as needed.   No facility-administered encounter medications on file as of 02/14/2015.      Review of Systems  Constitutional: Negative.   HENT: Negative.   Eyes: Negative.   Respiratory: Positive for cough (chest congestion and cough).   Cardiovascular: Negative.   Gastrointestinal: Negative.   Endocrine: Negative.   Genitourinary: Negative.   Musculoskeletal: Negative.   Skin: Negative.   Allergic/Immunologic: Negative.   Neurological: Negative.   Hematological: Negative.   Psychiatric/Behavioral: Negative.        Objective:   Physical Exam  Constitutional: He is oriented to person, place, and time. He appears well-developed and well-nourished. No distress.  The patient is pleasant and calm and alert  HENT:  Head: Normocephalic and atraumatic.  Right Ear: External ear normal.  Left Ear: External ear normal.  Mouth/Throat: Oropharynx is clear and moist. No oropharyngeal exudate.  Nasal turbinate  congestion  Eyes: Conjunctivae and EOM are normal. Pupils are equal, round, and reactive to light. Right eye exhibits no discharge. Left eye exhibits no discharge. No scleral icterus.  Neck: Normal range of motion. Neck supple. No thyromegaly present.  There are no carotid bruits thyromegaly or adenopathy  Cardiovascular: Normal rate, regular rhythm, normal heart sounds and intact distal pulses.   No murmur heard. The heart has a regular rate and rhythm at 60/m  Pulmonary/Chest: Effort normal and breath sounds normal. No respiratory distress. He has no wheezes. He has no rales. He exhibits no tenderness.  The chest is clear anteriorly and posteriorly  Abdominal: Soft. Bowel sounds are normal. He exhibits no mass. There is no tenderness. There is no rebound and no guarding.  No abdominal tenderness masses or organ enlargement  Genitourinary: Rectum normal, prostate normal and penis normal.  The prostate is slightly enlarged but smooth without lumps or masses and the rectal exam was negative. There were no inguinal nodes and no inguinal hernias palpable. The patient is missing the right testicle secondary to removal for suspicious nodule that was benign  Musculoskeletal: Normal range of motion. He exhibits no edema or tenderness.  Lymphadenopathy:    He has no cervical adenopathy.  Neurological: He is alert and oriented to person, place, and time. He has normal reflexes. No cranial nerve deficit.  Skin: Skin is warm and dry. No rash noted. No erythema. No pallor.  Psychiatric: He has a normal mood and affect. His behavior is normal. Judgment and thought content normal.  Nursing note and  vitals reviewed.   BP 138/83 mmHg  Pulse 61  Temp(Src) 98.1 F (36.7 C) (Oral)  Ht _0  (1.753 m)  Wt 165 lb 12.8 oz (75.206 kg)  BMI 24.47 kg/m2  Home blood pressures have been running in the 117 range over the 77 range.     Assessment & Plan:  1. Annual physical exam -The patient should return his  FOBT and he will need to be scheduled for a colonoscopy -He should check with his insurance regarding the Prevnar vaccine and the shingles shot -He also needs to give [place] a urinalysis - POCT CBC - BMP8+EGFR - NMR, lipoprofile - Hepatic function panel - Thyroid Panel With TSH - Vit D  25 hydroxy (rtn osteoporosis monitoring) - PSA  2. Cough -He should take Mucinex for cough and congestion and use normal saline  3. BPH (benign prostatic hyperplasia) -He has no problems with voiding and no erectile dysfunction issues  4. Palpitations -The palpitations have pretty much resolved and he attributes this to alleviating a lot of stresses in his life which were affecting his heart rate.  Patient Instructions  The patient should continue to follow his diet regularly He should exercise regularly He should D stress as much as possible and reduce the caffeine intake as much as possible He should drink plenty of fluids daily and avoid diet beverages He should check with his insurance regarding the Prevnar vaccine and the shingles shot We will arrange for him to have a routine colonoscopy He should return the FOBT We will call with the lab work results as soon as it becomes available Take Mucinex maximum strength, blue and white in color, 1 twice daily for cough and congestion with a large glass of water Use nasal saline regularly   Arrie Senate MD

## 2015-02-15 LAB — THYROID PANEL WITH TSH
Free Thyroxine Index: 2.1 (ref 1.2–4.9)
T3 Uptake Ratio: 28 % (ref 24–39)
T4 TOTAL: 7.6 ug/dL (ref 4.5–12.0)
TSH: 0.729 u[IU]/mL (ref 0.450–4.500)

## 2015-02-15 LAB — BMP8+EGFR
BUN / CREAT RATIO: 10 (ref 9–20)
BUN: 11 mg/dL (ref 6–24)
CALCIUM: 9.5 mg/dL (ref 8.7–10.2)
CO2: 24 mmol/L (ref 18–29)
Chloride: 100 mmol/L (ref 97–108)
Creatinine, Ser: 1.07 mg/dL (ref 0.76–1.27)
GFR calc Af Amer: 93 mL/min/{1.73_m2} (ref >59)
GFR calc non Af Amer: 81 mL/min/{1.73_m2} (ref >59)
GLUCOSE: 87 mg/dL (ref 65–99)
Potassium: 4.4 mmol/L (ref 3.5–5.2)
Sodium: 142 mmol/L (ref 134–144)

## 2015-02-15 LAB — VITAMIN D 25 HYDROXY (VIT D DEFICIENCY, FRACTURES): VIT D 25 HYDROXY: 36.7 ng/mL (ref 30.0–100.0)

## 2015-02-15 LAB — NMR, LIPOPROFILE
CHOLESTEROL: 215 mg/dL — AB (ref 100–199)
HDL CHOLESTEROL BY NMR: 75 mg/dL (ref >39)
HDL Particle Number: 37.3 umol/L (ref >=30.5)
LDL PARTICLE NUMBER: 1172 nmol/L — AB (ref <1000)
LDL SIZE: 21.4 nm (ref >20.5)
LDL-C: 122 mg/dL — ABNORMAL HIGH (ref 0–99)
Small LDL Particle Number: 285 nmol/L (ref <=527)
TRIGLYCERIDES BY NMR: 92 mg/dL (ref 0–149)

## 2015-02-15 LAB — HEPATIC FUNCTION PANEL
ALBUMIN: 4.8 g/dL (ref 3.5–5.5)
ALT: 23 IU/L (ref 0–44)
AST: 20 IU/L (ref 0–40)
Alkaline Phosphatase: 66 IU/L (ref 39–117)
Bilirubin Total: 0.5 mg/dL (ref 0.0–1.2)
Bilirubin, Direct: 0.14 mg/dL (ref 0.00–0.40)
TOTAL PROTEIN: 6.9 g/dL (ref 6.0–8.5)

## 2015-02-15 LAB — PSA: Prostate Specific Ag, Serum: 1 ng/mL (ref 0.0–4.0)

## 2015-02-16 ENCOUNTER — Telehealth: Payer: Self-pay | Admitting: *Deleted

## 2015-02-16 NOTE — Telephone Encounter (Signed)
-----   Message from Zannie Cove, LPN sent at 8/0/8811  7:59 AM EDT -----   ----- Message -----    From: Chipper Herb, MD    Sent: 02/16/2015   6:57 AM      To: Cleda Clarks Bullins, LPN  The blood sugar is good at 87. The creatinine, the most important kidney function test is within normal limits. The electrolytes including potassium are good. Cholesterol numbers with advanced lipid testing are greatly improved from 2 years ago. The LDL particle number still remains elevated and not far from goal at 1172. The LDL C also remains elevated. The triglycerides are good. The good cholesterol or the HDL particle number is excellent.------ the patient should continue with aggressive therapeutic lifestyle changes which include diet and exercise. He should have a repeat advanced lipid panel in 6 months with liver function tests.+++++ we would like to get the LDL particle number less than 1000. All liver function tests are within normal limits All thyroid function tests are within normal limits The vitamin D level is at the low end of the normal range, please make sure that the patient is taking vitamin D3, 1000, 1 daily in addition to his multivitamin The PSA remains low and within normal limits.

## 2015-02-20 ENCOUNTER — Other Ambulatory Visit: Payer: Self-pay | Admitting: *Deleted

## 2015-02-20 MED ORDER — AZITHROMYCIN 250 MG PO TABS: ORAL_TABLET | ORAL | Status: DC

## 2015-02-27 ENCOUNTER — Other Ambulatory Visit: Payer: Self-pay | Admitting: *Deleted

## 2015-02-27 MED ORDER — PREDNISONE 10 MG PO TABS: ORAL_TABLET | ORAL | Status: DC

## 2015-09-10 ENCOUNTER — Ambulatory Visit (INDEPENDENT_AMBULATORY_CARE_PROVIDER_SITE_OTHER): Payer: 59 | Admitting: Family Medicine

## 2015-09-10 ENCOUNTER — Encounter: Payer: Self-pay | Admitting: Family Medicine

## 2015-09-10 VITALS — BP 146/77 | HR 64 | Temp 97.4°F | Ht 69.0 in | Wt 172.6 lb

## 2015-09-10 DIAGNOSIS — R059 Cough, unspecified: Secondary | ICD-10-CM

## 2015-09-10 DIAGNOSIS — R05 Cough: Secondary | ICD-10-CM | POA: Diagnosis not present

## 2015-09-10 DIAGNOSIS — J101 Influenza due to other identified influenza virus with other respiratory manifestations: Secondary | ICD-10-CM

## 2015-09-10 LAB — POCT INFLUENZA A/B
INFLUENZA B, POC: NEGATIVE
Influenza A, POC: POSITIVE — AB

## 2015-09-10 MED ORDER — BENZONATATE 200 MG PO CAPS: 200.0000 mg | ORAL_CAPSULE | Freq: Three times a day (TID) | ORAL | Status: DC | PRN

## 2015-09-10 MED ORDER — OSELTAMIVIR PHOSPHATE 75 MG PO CAPS
75.0000 mg | ORAL_CAPSULE | Freq: Two times a day (BID) | ORAL | Status: DC
Start: 2015-09-10 — End: 2016-01-09

## 2015-09-10 MED ORDER — HYDROCODONE-HOMATROPINE 5-1.5 MG/5ML PO SYRP: 5.0000 mL | ORAL_SOLUTION | Freq: Four times a day (QID) | ORAL | Status: DC | PRN
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
## 2015-09-20 ENCOUNTER — Other Ambulatory Visit: Payer: 59

## 2015-09-20 DIAGNOSIS — Z1211 Encounter for screening for malignant neoplasm of colon: Secondary | ICD-10-CM

## 2015-09-22 LAB — FECAL OCCULT BLOOD, IMMUNOCHEMICAL: Fecal Occult Bld: NEGATIVE

## 2016-01-09 ENCOUNTER — Ambulatory Visit (INDEPENDENT_AMBULATORY_CARE_PROVIDER_SITE_OTHER): Payer: 59 | Admitting: Physician Assistant

## 2016-01-09 ENCOUNTER — Encounter: Payer: Self-pay | Admitting: Physician Assistant

## 2016-01-09 VITALS — BP 135/82 | HR 64 | Temp 97.3°F | Ht 69.0 in | Wt 177.6 lb

## 2016-01-09 DIAGNOSIS — J069 Acute upper respiratory infection, unspecified: Secondary | ICD-10-CM | POA: Diagnosis not present

## 2016-01-09 MED ORDER — AZITHROMYCIN 250 MG PO TABS: ORAL_TABLET | ORAL | Status: DC

## 2016-01-09 NOTE — Patient Instructions (Signed)
Upper Respiratory Infection, Adult Most upper respiratory infections (URIs) are a viral infection of the air passages leading to the lungs. A URI affects the nose, throat, and upper air passages. The most common type of URI is nasopharyngitis and is typically referred to as "the common cold." URIs run their course and usually go away on their own. Most of the time, a URI does not require medical attention, but sometimes a bacterial infection in the upper airways can follow a viral infection. This is called a secondary infection. Sinus and middle ear infections are common types of secondary upper respiratory infections. Bacterial pneumonia can also complicate a URI. A URI can worsen asthma and chronic obstructive pulmonary disease (COPD). Sometimes, these complications can require emergency medical care and may be life threatening.  CAUSES Almost all URIs are caused by viruses. A virus is a type of germ and can spread from one person to another.  RISKS FACTORS You may be at risk for a URI if:   You smoke.   You have chronic heart or lung disease.  You have a weakened defense (immune) system.   You are very young or very old.   You have nasal allergies or asthma.  You work in crowded or poorly ventilated areas.  You work in health care facilities or schools. SIGNS AND SYMPTOMS  Symptoms typically develop 2-3 days after you come in contact with a cold virus. Most viral URIs last 7-10 days. However, viral URIs from the influenza virus (flu virus) can last 14-18 days and are typically more severe. Symptoms may include:   Runny or stuffy (congested) nose.   Sneezing.   Cough.   Sore throat.   Headache.   Fatigue.   Fever.   Loss of appetite.   Pain in your forehead, behind your eyes, and over your cheekbones (sinus pain).  Muscle aches.  DIAGNOSIS  Your health care provider may diagnose a URI by:  Physical exam.  Tests to check that your symptoms are not due to  another condition such as:  Strep throat.  Sinusitis.  Pneumonia.  Asthma. TREATMENT  A URI goes away on its own with time. It cannot be cured with medicines, but medicines may be prescribed or recommended to relieve symptoms. Medicines may help:  Reduce your fever.  Reduce your cough.  Relieve nasal congestion. HOME CARE INSTRUCTIONS   Take medicines only as directed by your health care provider.   Gargle warm saltwater or take cough drops to comfort your throat as directed by your health care provider.  Use a warm mist humidifier or inhale steam from a shower to increase air moisture. This may make it easier to breathe.  Drink enough fluid to keep your urine clear or pale yellow.   Eat soups and other clear broths and maintain good nutrition.   Rest as needed.   Return to work when your temperature has returned to normal or as your health care provider advises. You may need to stay home longer to avoid infecting others. You can also use a face mask and careful hand washing to prevent spread of the virus.  Increase the usage of your inhaler if you have asthma.   Do not use any tobacco products, including cigarettes, chewing tobacco, or electronic cigarettes. If you need help quitting, ask your health care provider. PREVENTION  The best way to protect yourself from getting a cold is to practice good hygiene.   Avoid oral or hand contact with people with cold   symptoms.   Wash your hands often if contact occurs.  There is no clear evidence that vitamin C, vitamin E, echinacea, or exercise reduces the chance of developing a cold. However, it is always recommended to get plenty of rest, exercise, and practice good nutrition.  SEEK MEDICAL CARE IF:   You are getting worse rather than better.   Your symptoms are not controlled by medicine.   You have chills.  You have worsening shortness of breath.  You have brown or red mucus.  You have yellow or brown nasal  discharge.  You have pain in your face, especially when you bend forward.  You have a fever.  You have swollen neck glands.  You have pain while swallowing.  You have white areas in the back of your throat. SEEK IMMEDIATE MEDICAL CARE IF:   You have severe or persistent:  Headache.  Ear pain.  Sinus pain.  Chest pain.  You have chronic lung disease and any of the following:  Wheezing.  Prolonged cough.  Coughing up blood.  A change in your usual mucus.  You have a stiff neck.  You have changes in your:  Vision.  Hearing.  Thinking.  Mood. MAKE SURE YOU:   Understand these instructions.  Will watch your condition.  Will get help right away if you are not doing well or get worse.   This information is not intended to replace advice given to you by your health care provider. Make sure you discuss any questions you have with your health care provider.   Document Released: 12/24/2000 Document Revised: 11/14/2014 Document Reviewed: 10/05/2013 Elsevier Interactive Patient Education 2016 [employer]  

## 2016-01-09 NOTE — Progress Notes (Signed)
Subjective:     Patient ID: [name], male   DOB: [birth year], 50 y.o.   MRN: [id number]  HPI Pt with progressive cough and fatigue He has been fighting the sx for 1 week and they continue to worsen Cough now productive of yellow sputum  Review of Systems  Constitutional: Positive for activity change, appetite change and fatigue.  HENT: Positive for congestion, postnasal drip and sinus pressure. Negative for ear discharge, ear pain, nosebleeds, rhinorrhea, sneezing, sore throat, tinnitus, trouble swallowing and voice change.   Respiratory: Positive for cough. Negative for wheezing.   Cardiovascular: Negative.        Objective:   Physical Exam  Constitutional: He appears well-developed and well-nourished.  HENT:  Right Ear: External ear normal.  Left Ear: External ear normal.  Mouth/Throat: Oropharynx is clear and moist. No oropharyngeal exudate.  Neck: Neck supple.  Cardiovascular: Normal rate, regular rhythm and normal heart sounds.   Pulmonary/Chest: Effort normal and breath sounds normal. No respiratory distress. He has no wheezes.  Lymphadenopathy:    He has no cervical adenopathy.  Nursing note and vitals reviewed.      Assessment:     1. Acute upper respiratory infection        Plan:     Since sx continue to progress over 1 week Zpack Fluids Rest OTC meds for sx F/U prn

## 2016-01-24 ENCOUNTER — Telehealth: Payer: Self-pay | Admitting: Family Medicine

## 2016-01-24 MED ORDER — AMOXICILLIN 875 MG PO TABS: 875.0000 mg | ORAL_TABLET | Freq: Two times a day (BID) | ORAL | Status: DC

## 2016-01-24 NOTE — Telephone Encounter (Signed)
Pt are abx was sent to pharmacy

## 2016-01-24 NOTE — Telephone Encounter (Signed)
Please review and advise.

## 2016-01-24 NOTE — Telephone Encounter (Signed)
Amoxicillin rx sent to pharmacy 

## 2016-06-06 ENCOUNTER — Emergency Department
Admission: EM | Admit: 2016-06-06 | Discharge: 2016-06-06 | Disposition: A | Discharge status: Home / Self Care | Payer: 59 | Source: Home / Self Care | Attending: Family Medicine | Admitting: Family Medicine

## 2016-06-06 DIAGNOSIS — R300 Dysuria: Secondary | ICD-10-CM | POA: Diagnosis not present

## 2016-06-06 DIAGNOSIS — N390 Urinary tract infection, site not specified: Secondary | ICD-10-CM

## 2016-06-06 LAB — POCT URINALYSIS DIP (MANUAL ENTRY)
BILIRUBIN UA: NEGATIVE
GLUCOSE UA: NEGATIVE
Ketones, POC UA: NEGATIVE
Leukocytes, UA: NEGATIVE
NITRITE UA: NEGATIVE
Protein Ur, POC: NEGATIVE
SPEC GRAV UA: 1.015
Urobilinogen, UA: 0.2
pH, UA: 7

## 2016-06-06 MED ORDER — CIPROFLOXACIN HCL 500 MG PO TABS: 500.0000 mg | ORAL_TABLET | Freq: Two times a day (BID) | ORAL | 0 refills | Status: DC

## 2016-06-06 NOTE — ED Triage Notes (Signed)
Patient c/o urinary systems for approx one week and a few days. States that he feels like his bladder is not completely entering.

## 2016-06-06 NOTE — Discharge Instructions (Signed)
Increase fluid intake.

## 2016-06-06 NOTE — ED Provider Notes (Signed)
[facility]    CSN: [id number] Arrival date & time: 06/06/16  0803     History   Chief Complaint Chief Complaint  Patient presents with  . Urinary frequency    HPI [name] is a 50 y.o. male.   Patient complains of low back ache, urinary frequency, and nocturia for about 5 days.  No testicular pain or urethral discharge.  No fevers, chills, and sweats.  He feels well otherwise.   The history is provided by the patient.    History reviewed. No pertinent past medical history.  Patient Active Problem List   Diagnosis Date Noted  . Palpitations 01/05/2013  . Metatarsalgia, right 12/03/2011    Past Surgical History:  Procedure Laterality Date  . HERNIA REPAIR         Home Medications    Prior to Admission medications   Medication Sig Start Date End Date Taking? Authorizing Provider  ciprofloxacin (CIPRO) 500 MG tablet Take 1 tablet (500 mg total) by mouth 2 (two) times daily. 06/06/16   Kandra Nicolas, MD    Family History History reviewed. No pertinent family history.  Social History Social History  Substance Use Topics  . Smoking status: Never Smoker  . Smokeless tobacco: Never Used  . Alcohol use No     Allergies   Patient has no known allergies.   Review of Systems Review of Systems  Constitutional: Negative for activity change, appetite change, chills, diaphoresis, fatigue and fever.  HENT: Negative.   Eyes: Negative.   Respiratory: Negative.   Cardiovascular: Negative.   Gastrointestinal: Negative for nausea.  Genitourinary: Positive for frequency. Negative for difficulty urinating, discharge, dysuria, flank pain, genital sores, hematuria, penile pain, penile swelling, scrotal swelling, testicular pain and urgency.  Musculoskeletal: Negative.   Skin: Negative.      Physical Exam Triage Vital Signs ED Triage Vitals  Enc Vitals Group     BP      Pulse      Resp      Temp      Temp src      SpO2      Weight        Height      Head Circumference      Peak Flow      Pain Score      Pain Loc      Pain Edu?      Excl. in [place]?    No data found.   Updated Vital Signs BP 158/90 (BP Location: Left Arm)   Pulse 69   Temp 97.6 F (36.4 C) (Oral)   Ht 5\' 8"  (1.727 m)   Wt 181 lb (82.1 kg)   SpO2 97%   BMI 27.52 kg/m   Visual Acuity Right Eye Distance:   Left Eye Distance:   Bilateral Distance:    Right Eye Near:   Left Eye Near:    Bilateral Near:     Physical Exam Nursing notes and Vital Signs reviewed. Appearance:  Patient appears stated age, and in no acute distress Eyes:  Pupils are equal, round, and reactive to light and accomodation.  Extraocular movement is intact.  Conjunctivae are not inflamed  Ears:  Normal externally. Nose:  Normal Pharynx:  Normal; moist mucous membranes  Neck:  Supple.  No adenopathy Lungs:  Clear to auscultation.  Breath sounds are equal.  Moving air well. Heart:  Regular rate and rhythm without murmurs, rubs, or gallops.  Abdomen:  Nontender without masses  or hepatosplenomegaly.  Bowel sounds are present.  No CVA or flank tenderness.  Extremities:  No edema.  Skin:  No rash present.   Prostate exam deferred  UC Treatments / Results  Labs (all labs ordered are listed, but only abnormal results are displayed) Labs Reviewed  POCT URINALYSIS DIP (MANUAL ENTRY) - Abnormal; Notable for the following:       Result Value   Blood, UA trace-intact (*)    All other components within normal limits  URINE CULTURE    EKG  EKG Interpretation None       Radiology No results found.  Procedures Procedures (including critical care time)  Medications Ordered in UC Medications - No data to display   Initial Impression / Assessment and Plan / UC Course  I have reviewed the triage vital signs and the nursing notes.  Pertinent labs & imaging results that were available during my care of the patient were reviewed by me and considered in my medical  decision making (see chart for details).  Clinical Course   Suspect mild prostatitis.  Urine culture pending. Begin Cipro for 10 days. Increase fluid intake.  May take Ibuprofen 200mg , 4 tabs every 8 hours with food.  Followup with Family Doctor if not improved in 10 days.     Final Clinical Impressions(s) / UC Diagnoses   Final diagnoses:  Urinary tract infection without hematuria, site unspecified    New Prescriptions New Prescriptions   CIPROFLOXACIN (CIPRO) 500 MG TABLET    Take 1 tablet (500 mg total) by mouth 2 (two) times daily.     Kandra Nicolas, MD 06/18/16 [phone]

## 2016-06-10 LAB — URINE CULTURE

## 2016-06-11 ENCOUNTER — Telehealth: Payer: Self-pay | Admitting: Emergency Medicine

## 2016-06-11 NOTE — Telephone Encounter (Signed)
Culture was positive for bacteria, finish all meds , increase fluids, he is feeling a lot better.

## 2016-06-23 ENCOUNTER — Other Ambulatory Visit: Payer: 59

## 2016-06-23 ENCOUNTER — Telehealth: Payer: Self-pay | Admitting: Family Medicine

## 2016-06-23 DIAGNOSIS — R319 Hematuria, unspecified: Principal | ICD-10-CM

## 2016-06-23 DIAGNOSIS — N390 Urinary tract infection, site not specified: Secondary | ICD-10-CM

## 2016-06-23 LAB — URINALYSIS, COMPLETE
Bilirubin, UA: NEGATIVE
Glucose, UA: NEGATIVE
Ketones, UA: NEGATIVE
Leukocytes, UA: NEGATIVE
NITRITE UA: NEGATIVE
PH UA: 7 (ref 5.0–7.5)
Protein, UA: NEGATIVE
RBC, UA: NEGATIVE
Specific Gravity, UA: 1.015 (ref 1.005–1.030)
UUROB: 0.2 mg/dL (ref 0.2–1.0)

## 2016-06-23 LAB — MICROSCOPIC EXAMINATION
BACTERIA UA: NONE SEEN
EPITHELIAL CELLS (NON RENAL): NONE SEEN /HPF (ref 0–10)
RBC MICROSCOPIC, UA: NONE SEEN /HPF (ref 0–?)
RENAL EPITHEL UA: NONE SEEN /HPF

## 2016-06-23 MED ORDER — LEVOFLOXACIN 500 MG PO TABS: 500.0000 mg | ORAL_TABLET | Freq: Every day | ORAL | 0 refills | Status: DC

## 2016-06-23 NOTE — Telephone Encounter (Signed)
Per Dr Laurance Flatten, do repeat UA and culture Levaquin 500mg  qd x 14 days Pt notified

## 2016-06-24 LAB — URINE CULTURE: Organism ID, Bacteria: NO GROWTH

## 2016-07-04 ENCOUNTER — Ambulatory Visit (INDEPENDENT_AMBULATORY_CARE_PROVIDER_SITE_OTHER): Payer: 59 | Admitting: Sports Medicine

## 2016-07-04 ENCOUNTER — Encounter: Payer: Self-pay | Admitting: Sports Medicine

## 2016-07-04 DIAGNOSIS — M2141 Flat foot [pes planus] (acquired), right foot: Secondary | ICD-10-CM | POA: Diagnosis not present

## 2016-07-04 DIAGNOSIS — M2142 Flat foot [pes planus] (acquired), left foot: Secondary | ICD-10-CM

## 2016-07-04 DIAGNOSIS — M7741 Metatarsalgia, right foot: Secondary | ICD-10-CM

## 2016-07-04 DIAGNOSIS — M2140 Flat foot [pes planus] (acquired), unspecified foot: Secondary | ICD-10-CM | POA: Insufficient documentation

## 2016-07-04 NOTE — Assessment & Plan Note (Addendum)
Custom orthotics as above. 

## 2016-07-04 NOTE — Assessment & Plan Note (Signed)
We can add a metatarsal pad if still having symptoms despite orthotics.

## 2016-07-04 NOTE — Assessment & Plan Note (Deleted)
Metatarsal pad placed in the right orthotic

## 2016-07-04 NOTE — Progress Notes (Signed)

## 2017-01-09 ENCOUNTER — Ambulatory Visit (INDEPENDENT_AMBULATORY_CARE_PROVIDER_SITE_OTHER): Payer: 59

## 2017-01-09 ENCOUNTER — Ambulatory Visit (INDEPENDENT_AMBULATORY_CARE_PROVIDER_SITE_OTHER): Payer: 59 | Admitting: Family Medicine

## 2017-01-09 ENCOUNTER — Encounter: Payer: Self-pay | Admitting: Family Medicine

## 2017-01-09 VITALS — BP 133/84 | HR 76 | Temp 97.2°F | Ht 68.0 in | Wt 180.0 lb

## 2017-01-09 DIAGNOSIS — Z0000 Encounter for general adult medical examination without abnormal findings: Secondary | ICD-10-CM

## 2017-01-09 DIAGNOSIS — N400 Enlarged prostate without lower urinary tract symptoms: Secondary | ICD-10-CM

## 2017-01-09 DIAGNOSIS — Z1211 Encounter for screening for malignant neoplasm of colon: Secondary | ICD-10-CM

## 2017-01-09 LAB — URINALYSIS, COMPLETE
BILIRUBIN UA: NEGATIVE
GLUCOSE, UA: NEGATIVE
Ketones, UA: NEGATIVE
Leukocytes, UA: NEGATIVE
Nitrite, UA: NEGATIVE
PROTEIN UA: NEGATIVE
RBC UA: NEGATIVE
UUROB: 0.2 mg/dL (ref 0.2–1.0)
pH, UA: 7 (ref 5.0–7.5)

## 2017-01-09 LAB — MICROSCOPIC EXAMINATION
BACTERIA UA: NONE SEEN
Epithelial Cells (non renal): NONE SEEN /hpf (ref 0–10)
RBC, UA: NONE SEEN /hpf (ref 0–?)
Renal Epithel, UA: NONE SEEN /hpf
WBC UA: NONE SEEN /HPF (ref 0–?)

## 2017-01-09 NOTE — Patient Instructions (Addendum)
Continue current medications. Continue good therapeutic lifestyle changes which include good diet and exercise. Fall precautions discussed with patient. If an FOBT was given today- please return it to our front desk. If you are over 52 years old - you may need Prevnar 86 or the adult Pneumonia vaccine.  **Flu shots are available--- please call and schedule a FLU-CLINIC appointment**  After your visit with [place] today you will receive a survey in the mail or online from [employer] regarding your care with [place]. Please take a moment to fill this out. Your feedback is very important to [place] as you can help [place] better understand your patient needs as well as improve your experience and satisfaction. WE CARE ABOUT YOU!!!   We will arrange for you to have a colonoscopy with Dr. Hilarie Fredrickson Consider getting an eye exam with [facility] eye physicians in [place]. 2 good physicians there are Dr. Onnie Graham  and Dr. Renford Dills This summer drink plenty of fluids and stay well hydrated. We will call with results of your lab work as soon as these results become available We will arrange a stress test here with Dr Dettinger this summer and we will call you. Call your insurance and [facility]

## 2017-01-09 NOTE — Progress Notes (Signed)
Subjective:    Patient ID: [name], male    DOB: 1965/07/24, 51 y.o.   MRN: [id number]  HPI Patient is here today for annual wellness exam and follow up of chronic medical problems which includes BPH. He is not taking any medications.The patient has had trouble with his cholesterol in the past. He also has some hemorrhoid issues. He'll get an EKG today lab work today and a chest x-ray today. We will give him an FOBT to return. He is 51 years old and his last colonoscopy was in November 2006. He says he will schedule this. We will give him some suggestions. He does not need refills on any medicines. He does bring in some outside blood pressures for review and the majority of these are running in the upper 120s to 130s over the 60s to 70 range. These will be scanned into the record. The patient is doing well and in a good mood. He has some ongoing problems with his hemorrhoids as indicated. He does need an eye exam and he will arrange to get this. He says he's been under less stress and this has helped his overall sense of well-being with less palpitations etc. He denies chest pain shortness of breath problems with his intestinal tract including nausea vomiting diarrhea or blood in the stool. He's passing his water without problems and his sexual function is normal. The family history has his mom and dad both living and his father has high blood pressure and alcohol problems and his mother has some type of autoimmune disease. His mom is 38 years old his father is 32. His sister is in good health.     Patient Active Problem List   Diagnosis Date Noted  . Pes planus 07/04/2016  . Palpitations 01/05/2013  . Metatarsalgia of right foot 12/03/2011   Outpatient Encounter Prescriptions as of 01/09/2017  Medication Sig  . [DISCONTINUED] ciprofloxacin (CIPRO) 500 MG tablet Take 1 tablet (500 mg total) by mouth 2 (two) times daily.  . [DISCONTINUED] levofloxacin (LEVAQUIN) 500 MG tablet Take 1 tablet  (500 mg total) by mouth daily.   No facility-administered encounter medications on file as of 01/09/2017.       Review of Systems  Constitutional: Negative.   HENT: Negative.   Eyes: Negative.   Respiratory: Negative.   Cardiovascular: Negative.   Gastrointestinal: Negative.        Hemorrhoid  Endocrine: Negative.   Genitourinary: Negative.   Musculoskeletal: Negative.   Skin: Negative.   Allergic/Immunologic: Negative.   Neurological: Negative.   Hematological: Negative.   Psychiatric/Behavioral: Negative.        Objective:   Physical Exam  Constitutional: He is oriented to person, place, and time. He appears well-developed and well-nourished. No distress.  The patient is pleasant and alert and positive.  HENT:  Head: Normocephalic and atraumatic.  Right Ear: External ear normal.  Left Ear: External ear normal.  Nose: Nose normal.  Mouth/Throat: Oropharynx is clear and moist. No oropharyngeal exudate.  Eyes: Conjunctivae and EOM are normal. Pupils are equal, round, and reactive to light. Right eye exhibits no discharge. Left eye exhibits no discharge. No scleral icterus.  Neck: Normal range of motion. Neck supple. No thyromegaly present.  No bruits thyromegaly or anterior cervical adenopathy  Cardiovascular: Normal rate, regular rhythm, normal heart sounds and intact distal pulses.   No murmur heard. The heart is 60/m with a regular rate and rhythm  Pulmonary/Chest: Effort normal and breath sounds normal. No  respiratory distress. He has no wheezes. He has no rales. He exhibits no tenderness.  Clear anteriorly and posteriorly no axillary adenopathy  Abdominal: Soft. Bowel sounds are normal. He exhibits no mass. There is no tenderness. There is no rebound and no guarding.  No liver or spleen enlargement no masses no bruits and no inguinal adenopathy  Genitourinary: Rectum normal and penis normal.  Genitourinary Comments: The prostate was slightly enlarged but smooth  without lumps or masses. There was a small anal hemorrhoid tag that is not inflamed or irritated. There are no rectal masses. The patient is absent the right testicle secondary to surgery that was done many years ago for a benign lump. The left testicle is normal and no inguinal hernias were palpable. The external genitalia were otherwise within normal limits.  Musculoskeletal: Normal range of motion. He exhibits no edema.  Lymphadenopathy:    He has no cervical adenopathy.  Neurological: He is alert and oriented to person, place, and time. He has normal reflexes. No cranial nerve deficit.  Skin: Skin is warm and dry. No rash noted.  Psychiatric: He has a normal mood and affect. His behavior is normal. Judgment and thought content normal.  Nursing note and vitals reviewed.  BP 133/84 (BP Location: Left Arm)   Pulse 76   Temp 97.2 F (36.2 C) (Oral)   Ht _0  (1.727 m)   Wt 180 lb (81.6 kg)   BMI 27.37 kg/m         Assessment & Plan:  1. Annual physical exam -We will arrange for the patient to have a colonoscopy -We will encourage him to get a good eye exam -He should consider the new shingles shot - BMP8+EGFR - CBC with Differential/Platelet - Hepatic function panel - VITAMIN D 25 Hydroxy (Vit-D Deficiency, Fractures) - NMR, lipoprofile - PSA, total and free - Urinalysis, Complete - DG Chest 2 View; Future - EKG 12-Lead  2. Benign prostatic hyperplasia, unspecified whether lower urinary tract symptoms present -The patient is having no problems with passing his water. His prostate gland is only mildly enlarged and soft. - CBC with Differential/Platelet - PSA, total and free - Urinalysis, Complete  Patient Instructions  Continue current medications. Continue good therapeutic lifestyle changes which include good diet and exercise. Fall precautions discussed with patient. If an FOBT was given today- please return it to our front desk. If you are over 75 years old - you  may need Prevnar 42 or the adult Pneumonia vaccine.  **Flu shots are available--- please call and schedule a FLU-CLINIC appointment**  After your visit with [place] today you will receive a survey in the mail or online from [employer] regarding your care with [place]. Please take a moment to fill this out. Your feedback is very important to [place] as you can help [place] better understand your patient needs as well as improve your experience and satisfaction. WE CARE ABOUT YOU!!!   We will arrange for you to have a colonoscopy with Dr. Hilarie Fredrickson Consider getting an eye exam with [facility] eye physicians in [place]. 2 good physicians there are Dr. Onnie Graham  and Dr. Renford Dills This summer drink plenty of fluids and stay well hydrated. We will call with results of your lab work as soon as these results become available    Arrie Senate MD

## 2017-01-10 LAB — HEPATIC FUNCTION PANEL
ALK PHOS: 65 IU/L (ref 39–117)
ALT: 30 IU/L (ref 0–44)
AST: 24 IU/L (ref 0–40)
Albumin: 4.8 g/dL (ref 3.5–5.5)
BILIRUBIN TOTAL: 0.5 mg/dL (ref 0.0–1.2)
BILIRUBIN, DIRECT: 0.13 mg/dL (ref 0.00–0.40)
Total Protein: 7.3 g/dL (ref 6.0–8.5)

## 2017-01-10 LAB — BMP8+EGFR
BUN/Creatinine Ratio: 12 (ref 9–20)
BUN: 13 mg/dL (ref 6–24)
CALCIUM: 9.4 mg/dL (ref 8.7–10.2)
CO2: 24 mmol/L (ref 20–29)
CREATININE: 1.11 mg/dL (ref 0.76–1.27)
Chloride: 100 mmol/L (ref 96–106)
GFR calc Af Amer: 88 mL/min/{1.73_m2} (ref >59)
GFR, EST NON AFRICAN AMERICAN: 76 mL/min/{1.73_m2} (ref >59)
Glucose: 93 mg/dL (ref 65–99)
POTASSIUM: 4.1 mmol/L (ref 3.5–5.2)
Sodium: 141 mmol/L (ref 134–144)

## 2017-01-10 LAB — CBC WITH DIFFERENTIAL/PLATELET
BASOS: 1 %
Basophils Absolute: 0.1 10*3/uL (ref 0.0–0.2)
EOS (ABSOLUTE): 0.1 10*3/uL (ref 0.0–0.4)
EOS: 2 %
HEMATOCRIT: 44.8 % (ref 37.5–51.0)
HEMOGLOBIN: 15.5 g/dL (ref 13.0–17.7)
IMMATURE GRANS (ABS): 0 10*3/uL (ref 0.0–0.1)
IMMATURE GRANULOCYTES: 0 %
LYMPHS: 37 %
Lymphocytes Absolute: 2.3 10*3/uL (ref 0.7–3.1)
MCH: 31.3 pg (ref 26.6–33.0)
MCHC: 34.6 g/dL (ref 31.5–35.7)
MCV: 90 fL (ref 79–97)
MONOCYTES: 7 %
Monocytes Absolute: 0.5 10*3/uL (ref 0.1–0.9)
NEUTROS ABS: 3.3 10*3/uL (ref 1.4–7.0)
NEUTROS PCT: 53 %
PLATELETS: 250 10*3/uL (ref 150–379)
RBC: 4.96 x10E6/uL (ref 4.14–5.80)
RDW: 14.1 % (ref 12.3–15.4)
WBC: 6.2 10*3/uL (ref 3.4–10.8)

## 2017-01-10 LAB — NMR, LIPOPROFILE
Cholesterol: 233 mg/dL — ABNORMAL HIGH (ref 100–199)
HDL CHOLESTEROL BY NMR: 64 mg/dL (ref >39)
HDL PARTICLE NUMBER: 34 umol/L (ref >=30.5)
LDL Particle Number: 1786 nmol/L — ABNORMAL HIGH (ref <1000)
LDL SIZE: 21.2 nm (ref >20.5)
LDL-C: 153 mg/dL — ABNORMAL HIGH (ref 0–99)
LP-IR Score: <25 (ref <=45)
SMALL LDL PARTICLE NUMBER: 553 nmol/L — AB (ref <=527)
Triglycerides by NMR: 80 mg/dL (ref 0–149)

## 2017-01-10 LAB — PSA, TOTAL AND FREE
PROSTATE SPECIFIC AG, SERUM: 1.3 ng/mL (ref 0.0–4.0)
PSA FREE: 0.29 ng/mL
PSA, Free Pct: 22.3 %

## 2017-01-10 LAB — VITAMIN D 25 HYDROXY (VIT D DEFICIENCY, FRACTURES): VIT D 25 HYDROXY: 44.4 ng/mL (ref 30.0–100.0)

## 2017-01-13 ENCOUNTER — Telehealth: Payer: Self-pay | Admitting: *Deleted

## 2017-01-13 NOTE — Telephone Encounter (Signed)
Please review for treadmill  

## 2017-01-22 NOTE — Telephone Encounter (Signed)
Lm to call back to schedule treadmill

## 2017-01-22 NOTE — Telephone Encounter (Signed)
Appointment scheduled for 03/26/2017 @ 7:30am. Instructions mailed to patient.

## 2017-01-22 NOTE — Telephone Encounter (Signed)
Okay to schedule for treadmill, patient does not carry a diagnosis that would necessarily support, I did notice that his cholesterol was slightly elevated so if we had a diagnosis of dyslipidemia as the reasoning, that could work

## 2017-02-09 ENCOUNTER — Encounter: Payer: Self-pay | Admitting: Family Medicine

## 2017-02-09 ENCOUNTER — Encounter: Payer: Self-pay | Admitting: Internal Medicine

## 2017-02-09 ENCOUNTER — Other Ambulatory Visit: Payer: Self-pay | Admitting: *Deleted

## 2017-02-09 DIAGNOSIS — K649 Unspecified hemorrhoids: Secondary | ICD-10-CM

## 2017-04-24 ENCOUNTER — Ambulatory Visit (AMBULATORY_SURGERY_CENTER): Payer: Self-pay | Admitting: *Deleted

## 2017-04-24 ENCOUNTER — Encounter: Payer: Self-pay | Admitting: Internal Medicine

## 2017-04-24 VITALS — Ht 68.0 in | Wt 178.2 lb

## 2017-04-24 DIAGNOSIS — Z1211 Encounter for screening for malignant neoplasm of colon: Secondary | ICD-10-CM

## 2017-04-24 MED ORDER — NA SULFATE-K SULFATE-MG SULF 17.5-3.13-1.6 GM/177ML PO SOLN: 1.0000 [IU] | Freq: Once | ORAL | 0 refills | Status: AC

## 2017-04-24 NOTE — Progress Notes (Signed)
No egg or soy allergy known to patient  No issues with past sedation with any surgeries  or procedures, no intubation problems  No diet pills per patient No home 02 use per patient  No blood thinners per patient  Pt denies issues with constipation  No A fib or A flutter  EMMI video sent to pt's e mail pt declined   

## 2017-04-30 ENCOUNTER — Ambulatory Visit (INDEPENDENT_AMBULATORY_CARE_PROVIDER_SITE_OTHER): Payer: 59

## 2017-04-30 ENCOUNTER — Encounter: Payer: Self-pay | Admitting: Family Medicine

## 2017-04-30 ENCOUNTER — Ambulatory Visit (INDEPENDENT_AMBULATORY_CARE_PROVIDER_SITE_OTHER): Payer: 59 | Admitting: Family Medicine

## 2017-04-30 VITALS — BP 132/77 | HR 78 | Temp 97.4°F | Ht 68.0 in | Wt 181.0 lb

## 2017-04-30 DIAGNOSIS — M79671 Pain in right foot: Secondary | ICD-10-CM

## 2017-04-30 NOTE — Progress Notes (Signed)
BP 132/77   Pulse 78   Temp (!) 97.4 F (36.3 C) (Oral)   Ht 5\' 8"  (1.727 m)   Wt 181 lb (82.1 kg)   BMI 27.52 kg/m    Subjective:    Patient ID: [name], male    DOB: November 03, 1965, 51 y.o.   MRN: [id number]  HPI: Lucas W Schreur is a 51 y.o. male presenting on 04/30/2017 for No chief complaint on file.   HPI Right foot pain Patient is coming in with right foot pain that is been bothering him on and off over the past couple years but especially has been bothering him over the past 2 weeks.  He feels like he may have hit it on something like a bedpost and it has been bothering him more.  It hurts near the base of the fourth metatarsal.  He feels like he has swelling there as well and difficulty with ambulation from it.  He has been trying inserts and other alternatives.  Patient does work as a Community education officer and so he has been doing a lot of physical therapy things for it and it does not seem to be improving.  Relevant past medical, surgical, family and social history reviewed and updated as indicated. Interim medical history since our last visit reviewed. Allergies and medications reviewed and updated.  Review of Systems  Constitutional: Negative for chills and fever.  Respiratory: Negative for shortness of breath and wheezing.   Cardiovascular: Negative for chest pain and leg swelling.  Musculoskeletal: Positive for arthralgias and joint swelling. Negative for back pain and gait problem.  Skin: Negative for rash.  All other systems reviewed and are negative.   Per HPI unless specifically indicated above     Objective:    BP 132/77   Pulse 78   Temp (!) 97.4 F (36.3 C) (Oral)   Ht 5\' 8"  (1.727 m)   Wt 181 lb (82.1 kg)   BMI 27.52 kg/m   Wt Readings from Last 3 Encounters:  04/30/17 181 lb (82.1 kg)  04/24/17 178 lb 3.2 oz (80.8 kg)  01/09/17 180 lb (81.6 kg)    Physical Exam  Constitutional: He is oriented to person, place, and time. He appears  well-developed and well-nourished. No distress.  Eyes: Conjunctivae are normal. No scleral icterus.  Musculoskeletal: Normal range of motion. He exhibits no edema.       Right foot: There is tenderness, bony tenderness (Near base of fourth metatarsal and third metatarsal) and swelling (Mild amount of swelling but no erythema). There is normal range of motion, no deformity and no laceration.  Neurological: He is alert and oriented to person, place, and time. Coordination normal.  Skin: Skin is warm and dry. No rash noted. He is not diaphoretic.  Psychiatric: He has a normal mood and affect. His behavior is normal.  Nursing note and vitals reviewed.  Right foot x-ray: Degeneration near base of third metatarsal and the joint between the metatarsal and tarsals.     Assessment & Plan:   Problem List Items Addressed This Visit    None    Visit Diagnoses    Foot pain, right    -  Primary   Looks like degenerative joints especially around the third tarsometatarsal joint   Relevant Orders   DG Foot Complete Right   Ambulatory referral to Orthopedic Surgery       Follow up plan: Return if symptoms worsen or fail to improve.  Counseling provided for all  of the vaccine components Orders Placed This Encounter  Procedures  . DG Foot Complete Right  . Ambulatory referral to [place] Dettinger, MD [place] 04/30/2017, 2:44 PM

## 2017-05-08 ENCOUNTER — Encounter: Payer: Self-pay | Admitting: Internal Medicine

## 2017-05-08 ENCOUNTER — Ambulatory Visit (AMBULATORY_SURGERY_CENTER): Payer: 59 | Admitting: Internal Medicine

## 2017-05-08 VITALS — BP 98/70 | HR 80 | Temp 98.6°F | Resp 17 | Ht 68.0 in | Wt 178.0 lb

## 2017-05-08 DIAGNOSIS — Z1212 Encounter for screening for malignant neoplasm of rectum: Secondary | ICD-10-CM | POA: Diagnosis not present

## 2017-05-08 DIAGNOSIS — D120 Benign neoplasm of cecum: Secondary | ICD-10-CM | POA: Diagnosis not present

## 2017-05-08 DIAGNOSIS — E669 Obesity, unspecified: Secondary | ICD-10-CM | POA: Diagnosis not present

## 2017-05-08 DIAGNOSIS — D125 Benign neoplasm of sigmoid colon: Secondary | ICD-10-CM

## 2017-05-08 DIAGNOSIS — Z1211 Encounter for screening for malignant neoplasm of colon: Secondary | ICD-10-CM | POA: Diagnosis present

## 2017-05-08 DIAGNOSIS — K635 Polyp of colon: Secondary | ICD-10-CM

## 2017-05-08 MED ORDER — SODIUM CHLORIDE 0.9 % IV SOLN: 500.0000 mL | INTRAVENOUS | Status: DC

## 2017-05-08 NOTE — Progress Notes (Signed)
Report to PACU, RN, vss, BBS= Clear.  

## 2017-05-08 NOTE — Patient Instructions (Signed)
YOU HAD AN ENDOSCOPIC PROCEDURE TODAY AT THE [facility]:   Refer to the procedure report that was given to you for any specific questions about what was found during the examination.  If the procedure report does not answer your questions, please call your gastroenterologist to clarify.  If you requested that your care partner not be given the details of your procedure findings, then the procedure report has been included in a sealed envelope for you to review at your convenience later.  YOU SHOULD EXPECT: Some feelings of bloating in the abdomen. Passage of more gas than usual.  Walking can help get rid of the air that was put into your GI tract during the procedure and reduce the bloating. If you had a lower endoscopy (such as a colonoscopy or flexible sigmoidoscopy) you may notice spotting of blood in your stool or on the toilet paper. If you underwent a bowel prep for your procedure, you may not have a normal bowel movement for a few days.  Please Note:  You might notice some irritation and congestion in your nose or some drainage.  This is from the oxygen used during your procedure.  There is no need for concern and it should clear up in a day or so.  SYMPTOMS TO REPORT IMMEDIATELY:   Following lower endoscopy (colonoscopy or flexible sigmoidoscopy):  Excessive amounts of blood in the stool  Significant tenderness or worsening of abdominal pains  Swelling of the abdomen that is new, acute  Fever of 100F or higher   For urgent or emergent issues, a gastroenterologist can be reached at any hour by calling [phone].   DIET:  We do recommend a small meal at first, but then you may proceed to your regular diet.  Drink plenty of fluids but you should avoid alcoholic beverages for 24 hours.  ACTIVITY:  You should plan to take it easy for the rest of today and you should NOT DRIVE or use heavy machinery until tomorrow (because of the sedation medicines used during the test).     FOLLOW UP: Our staff will call the number listed on your records the next business day following your procedure to check on you and address any questions or concerns that you may have regarding the information given to you following your procedure. If we do not reach you, we will leave a message.  However, if you are feeling well and you are not experiencing any problems, there is no need to return our call.  We will assume that you have returned to your regular daily activities without incident.  If any biopsies were taken you will be contacted by phone or by letter within the next 1-3 weeks.  Please call us at [phone] if you have not heard about the biopsies in 3 weeks.    SIGNATURES/CONFIDENTIALITY: You and/or your care partner have signed paperwork which will be entered into your electronic medical record.  These signatures attest to the fact that that the information above on your After Visit Summary has been reviewed and is understood.  Full responsibility of the confidentiality of this discharge information lies with you and/or your care-partner.   Handouts were given to your care partner on polyps, diverticulosis, and hemorrhoids. You may resume your current medications today. Await biopsy results. Please call if any questions or concerns.   

## 2017-05-08 NOTE — Progress Notes (Signed)
No problems noted in the recovery room. maw 

## 2017-05-08 NOTE — Op Note (Signed)
[facility] Patient Name: [name] Procedure Date: 05/08/2017 9:04 AM MRN: [id number] Endoscopist: Jerene Bears , MD Age: 51 Referring MD:  Date of Birth: 1966/03/06 Gender: Male Account #: [id number] Procedure:                Colonoscopy Indications:              Screening for colorectal malignant neoplasm Medicines:                Monitored Anesthesia Care Procedure:                Pre-Anesthesia Assessment:                           - Prior to the procedure, a History and Physical                            was performed, and patient medications and                            allergies were reviewed. The patient's tolerance of                            previous anesthesia was also reviewed. The risks                            and benefits of the procedure and the sedation                            options and risks were discussed with the patient.                            All questions were answered, and informed consent                            was obtained. Prior Anticoagulants: The patient has                            taken no previous anticoagulant or antiplatelet                            agents. ASA Grade Assessment: II - A patient with                            mild systemic disease. After reviewing the risks                            and benefits, the patient was deemed in                            satisfactory condition to undergo the procedure.                           After obtaining informed consent, the colonoscope  was passed under direct vision. Throughout the                            procedure, the patient's blood pressure, pulse, and                            oxygen saturations were monitored continuously. The                            Colonoscope was introduced through the anus and                            advanced to the the cecum, identified by                            appendiceal orifice and  ileocecal valve. The                            colonoscopy was performed without difficulty. The                            patient tolerated the procedure well. The quality                            of the bowel preparation was excellent. The                            ileocecal valve, appendiceal orifice, and rectum                            were photographed. Scope In: 9:05:42 AM Scope Out: 9:16:32 AM Scope Withdrawal Time: 0 hours 9 minutes 39 seconds  Total Procedure Duration: 0 hours 10 minutes 50 seconds  Findings:                 The perianal and digital rectal examinations were                            normal.                           A 4 mm polyp was found in the cecum. The polyp was                            sessile. The polyp was removed with a cold snare.                            Resection and retrieval were complete.                           A 3 mm polyp was found in the sigmoid colon. The                            polyp was sessile. The polyp was removed with a  cold snare. Resection and retrieval were complete.                           Multiple medium-mouthed diverticula were found in                            the ascending colon and cecum.                           Internal hemorrhoids were found during                            retroflexion. The hemorrhoids were small. Complications:            No immediate complications. Estimated Blood Loss:     Estimated blood loss was minimal. Impression:               - One 4 mm polyp in the cecum, removed with a cold                            snare. Resected and retrieved.                           - One 3 mm polyp in the sigmoid colon, removed with                            a cold snare. Resected and retrieved.                           - Mild diverticulosis in the ascending colon and in                            the cecum.                           - Small internal  hemorrhoids. Recommendation:           - Patient has a contact number available for                            emergencies. The signs and symptoms of potential                            delayed complications were discussed with the                            patient. Return to normal activities tomorrow.                            Written discharge instructions were provided to the                            patient.                           - Resume previous diet.                           -  Continue present medications.                           - Await pathology results.                           - Repeat colonoscopy is recommended. The                            colonoscopy date will be determined after pathology                            results from today's exam become available for                            review. Jerene Bears, MD 05/08/2017 9:19:34 AM This report has been signed electronically.

## 2017-05-08 NOTE — Progress Notes (Signed)
Called to room to assist during endoscopic procedure.  Patient ID and intended procedure confirmed with present staff. Received instructions for my participation in the procedure from the performing physician.  

## 2017-05-11 ENCOUNTER — Telehealth: Payer: Self-pay

## 2017-05-11 ENCOUNTER — Telehealth: Payer: Self-pay | Admitting: *Deleted

## 2017-05-11 NOTE — Telephone Encounter (Signed)
Left message

## 2017-05-11 NOTE — Telephone Encounter (Signed)
  Follow up Call-  Call back number 05/08/2017  Post procedure Call Back phone  # [phone]  Permission to leave phone message Yes  Some recent data might be hidden     Patient questions:  Do you have a fever, pain , or abdominal swelling? No. Pain Score  0 *  Have you tolerated food without any problems? Yes.    Have you been able to return to your normal activities? Yes.    Do you have any questions about your discharge instructions: Diet   No. Medications  No. Follow up visit  No.  Do you have questions or concerns about your Care? No.  Actions: * If pain score is 4 or above: No action needed, pain <4.

## 2017-05-12 ENCOUNTER — Encounter: Payer: Self-pay | Admitting: Internal Medicine

## 2017-05-21 ENCOUNTER — Ambulatory Visit (INDEPENDENT_AMBULATORY_CARE_PROVIDER_SITE_OTHER): Payer: 59

## 2017-05-21 DIAGNOSIS — R002 Palpitations: Secondary | ICD-10-CM | POA: Diagnosis not present

## 2017-05-21 DIAGNOSIS — E782 Mixed hyperlipidemia: Secondary | ICD-10-CM

## 2017-05-21 DIAGNOSIS — Z0000 Encounter for general adult medical examination without abnormal findings: Secondary | ICD-10-CM

## 2017-05-27 LAB — EXERCISE TOLERANCE TEST
CHL CUP RESTING HR STRESS: 60 {beats}/min
CHL RATE OF PERCEIVED EXERTION: 6
Estimated workload: 10.5 METS
Exercise duration (min): 9 min
Exercise duration (sec): 42 s
MPHR: 168 {beats}/min
Peak HR: 148 {beats}/min
Percent HR: 88 %

## 2017-07-24 ENCOUNTER — Encounter: Payer: Self-pay | Admitting: Family Medicine

## 2017-07-24 ENCOUNTER — Ambulatory Visit (INDEPENDENT_AMBULATORY_CARE_PROVIDER_SITE_OTHER): Payer: 59 | Admitting: Family Medicine

## 2017-07-24 ENCOUNTER — Ambulatory Visit: Payer: 59 | Admitting: Pediatrics

## 2017-07-24 VITALS — BP 138/83 | HR 80 | Temp 98.6°F | Ht 68.0 in | Wt 181.0 lb

## 2017-07-24 DIAGNOSIS — J189 Pneumonia, unspecified organism: Secondary | ICD-10-CM | POA: Diagnosis not present

## 2017-07-24 DIAGNOSIS — R6889 Other general symptoms and signs: Secondary | ICD-10-CM | POA: Diagnosis not present

## 2017-07-24 LAB — VERITOR FLU A/B WAIVED
INFLUENZA A: NEGATIVE
INFLUENZA B: NEGATIVE

## 2017-07-24 MED ORDER — AZITHROMYCIN 250 MG PO TABS: ORAL_TABLET | ORAL | 0 refills | Status: DC

## 2017-07-24 NOTE — Progress Notes (Signed)
Subjective:  HPI: 52 y/o Caucasian male presents to clinic with a productive cough x3 days. His symptoms started with a sore throat about 72 hours ago and transitioned to more respiratory problems such as trouble breathing, "burning of the chest", and wheezing. He rates the severity of his symptoms a 4/10. He has tried both Mucinex and NyQuil with mild relief. His symptoms are constant with no particular temporal pattern. His 26 year old daughter also has similar symptoms and is at home from school with a fever. Patient denies fever, chills, nausea, vomiting, diarrhea, and sinus pain.  ROS: General: (+) malaise (-) Fever, chills, fatigue HEENT:   Head: (-) HA  Eyes: (-) vision changes, redness, pain  Ears: (+) slight ear pressure (-) discharge, pain  Nose: (-) runny nose  Throat: (+) sore throat, hoarseness (-) dysphagia, odynophagia  Neck: (+) painful lymph nodes  Pulmonary: (+) productive cough: coughing up mucus, painful respiration, wheezing, dyspnea (-) hemoptysis Cardiovascular: (+) tightness (-) chest pain/discomfort GI: (-) N, V, D  Objective: Vitals: BP: 138/83 mmHg, slightly elevated; Temperature: 98.6 F, regular; HR: 80 bpm, regular; Pulse Ox: 96%, slightly decreased; Height: 5'8"; Weight: 181 lbs  PE: General: Patient appeared stated age, no signs of distress. Patient did not appear ill, but sounded extremely hoarse and coughing repetitively. AAO X3 HEENT:  Eyes: no conjunctival injection or discharge  Ears: external ears free of pain, lesions, and deformities with a clear canal. TM is pearly  gray, translucent with visible landmarks. Hearing intact bilaterally.  Throat: no visible mucosal lesions, bleeding, infection. Mild erythema in throat Neck: Trachea midline and mobile. Non-palpable, normal consistency and sized thyroid without lesions. Pulmonary: Mild, intermittent stridor on inspiration in bilateral upper lobes which clear with coughing. Rhonchi notes during  auscultation.  Assessment: Atypical pneumonia: Patient presented with classic signs of atypical pneumonia such as a productive cough, constitutional symptoms, malaise, dyspnea, and wheezing. On physical exam, patient had mild erythema in throat, stridor and rhonchi during auscultation. Patient's pulse ox was mildly decreased at 96% which is lower than patient's normal. He had no decrease in energy, but extreme hoarseness from constant cough. His influenza test came back negative.  Plan: Patient will be started on Azithromycin 250 mg tablet POD for 5 days. Patient has been educated on how to take the pills: 2 tablets on the first day and 1 tablet each day after for 5 days. Patient has been instructed to call the office or go to the ER if his symptoms worsen or he has trouble breathing. Patient has been educated on the ADRs of Azithromycin.  Patient was seen and examined with Lanna Poche PA student.  Agree with assessment and plan above. Caryl Pina, MD [facility] 07/28/2017, 2:27 PM

## 2017-10-19 ENCOUNTER — Ambulatory Visit: Payer: 59 | Admitting: Family Medicine

## 2017-10-19 ENCOUNTER — Encounter: Payer: Self-pay | Admitting: Family Medicine

## 2017-10-19 VITALS — BP 118/75 | HR 72 | Temp 97.1°F | Ht 68.0 in | Wt 176.8 lb

## 2017-10-19 DIAGNOSIS — J40 Bronchitis, not specified as acute or chronic: Secondary | ICD-10-CM

## 2017-10-19 MED ORDER — BENZONATATE 100 MG PO CAPS: 100.0000 mg | ORAL_CAPSULE | Freq: Three times a day (TID) | ORAL | 0 refills | Status: DC | PRN

## 2017-10-19 MED ORDER — AZITHROMYCIN 250 MG PO TABS: ORAL_TABLET | ORAL | 0 refills | Status: DC

## 2017-10-19 NOTE — Progress Notes (Signed)
Subjective: CC: Cough PCP: Chipper Herb, MD PRF:[name] is a 52 y.o. male presenting to clinic today for:  1. Cough Patient reports that he developed a cough about 10 days ago.  Cough is productive.  He reports associated fatigue, night sweats, chest congestion.  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  He denies nausea, vomiting, shortness of breath, wheeze, fevers.  He has been using cough drops for symptoms.  He reports good oral hydration.  Patient is a non-smoker.  No history of asthma or COPD.   ROS: Per HPI  No Known Allergies Past Medical History:  Diagnosis Date  . Hyperlipidemia    not on meds/diet controlled    Current Outpatient Medications:  .  co-enzyme Q-10 30 MG capsule, Take 30 mg by mouth 3 (three) times daily., Disp: , Rfl:  .  Multiple Vitamin (MULTIVITAMIN) tablet, Take 1 tablet by mouth daily., Disp: , Rfl:  .  Omega-3 Fatty Acids (FISH OIL OMEGA-3 PO), Take by mouth., Disp: , Rfl:  .  Red Yeast Rice Extract (RED YEAST RICE PO), Take by mouth., Disp: , Rfl:  Social History   Socioeconomic History  . Marital status: Married    Spouse name: Not on file  . Number of children: Not on file  . Years of education: Not on file  . Highest education level: Not on file  Occupational History  . Not on file  Social Needs  . Financial resource strain: Not on file  . Food insecurity:    Worry: Not on file    Inability: Not on file  . Transportation needs:    Medical: Not on file    Non-medical: Not on file  Tobacco Use  . Smoking status: Never Smoker  . Smokeless tobacco: Never Used  Substance and Sexual Activity  . Alcohol use: No  . Drug use: No  . Sexual activity: Not on file  Lifestyle  . Physical activity:    Days per week: Not on file    Minutes per session: Not on file  . Stress: Not on file  Relationships  . Social connections:    Talks on phone: Not on file    Gets together: Not on  file    Attends religious service: Not on file    Active member of club or organization: Not on file    Attends meetings of clubs or organizations: Not on file    Relationship status: Not on file  . Intimate partner violence:    Fear of current or ex partner: Not on file    Emotionally abused: Not on file    Physically abused: Not on file    Forced sexual activity: Not on file  Other Topics Concern  . Not on file  Social History Narrative  . Not on file   Family History  Problem Relation Age of Onset  . Hypertension Mother   . Hypertension Father   . Clotting disorder Neg Hx   . Colitis Neg Hx   . Esophageal cancer Neg Hx   . Rectal cancer Neg Hx   . Stomach cancer Neg Hx     Objective: Office vital signs reviewed. BP 118/75   Pulse 72   Temp (!) 97.1 F (36.2 C)   Ht 5\' 8"  (1.727 m)   Wt 176 lb 12.8 oz (80.2 kg)   BMI 26.88 kg/m   Physical Examination:  General: Awake, alert, well nourished,  nontoxic appearing, No acute distress HEENT: Normal      Ears: Tympanic membranes intact, normal light reflex, no erythema, no bulging    Eyes: PERRLA, extraocular membranes intact, sclera white    Nose: nasal turbinates moist, clear nasal discharge    Throat: moist mucus membranes, no erythema, no tonsillar exudate.  Airway is patent Cardio: regular rate and rhythm, S1S2 heard, no murmurs appreciated Pulm: Decreased breath sounds in the left lower lung fields.  Otherwise, clear to auscultation.  No wheezes, rhonchi or rales; normal work of breathing on room air  Assessment/ Plan: 52 y.o. male   1. Bronchitis Pulmonary exam significant for decreased breath sounds in the left lower lung fields.  Will cover for possible left lower lobe pneumonia.  Prescribed azithromycin to take for the next 5 days.  Tessalon Perles also prescribed.  I discussed with the patient that if symptoms do not improve or they worsen, please return for reevaluation.  Would recommend obtaining a chest  x-ray at that point.  Home care instructions were reviewed.  Follow-up as needed.   Meds ordered this encounter  Medications  . azithromycin (ZITHROMAX Z-PAK) 250 MG tablet    Sig: As directed    Dispense:  6 tablet    Refill:  0  . benzonatate (TESSALON) 100 MG capsule    Sig: Take 1 capsule (100 mg total) by mouth 3 (three) times daily as needed for cough.    Dispense:  20 capsule    Refill:  [place], DO [place] [phone]

## 2017-10-19 NOTE — Patient Instructions (Addendum)
Given the duration of your symptoms and what appears to be the increasing severity, I have elected to treat you with an oral antibiotic for coverage of any pulmonary infiltrates.  Your lung exam was remarkable for decreased breath sounds in the left lower lung.  I have sent in azithromycin and Tessalon Perles.  The Tessalon Perles are nonsedating and you can use these at work if needed for cough.  You may continue cough drops.  - Get plenty of rest and drink plenty of fluids. - Try to breathe moist air. Use a cold mist humidifier for nasal congestion or a warm mist humidifier for cough. - Consume warm fluids (soup or tea) to provide relief for a stuffy nose and to loosen phlegm. - For nasal stuffiness, try saline nasal spray or a Neti Pot. Afrin nasal spray can also be used but this product should not be used longer than 3 days or it will cause rebound nasal stuffiness (worsening nasal congestion). - For sore throat pain relief: suck on throat lozenges, hard candy or popsicles; gargle with warm salt water (1/4 tsp. salt per 8 oz. of water); and eat soft, bland foods. - Eat a well-balanced diet. If you cannot, ensure you are getting enough nutrients by taking a daily multivitamin. - Avoid dairy products, as they can thicken phlegm. - Avoid alcohol, as it impairs your body's immune system.  Acute Bronchitis, Adult Acute bronchitis is when air tubes (bronchi) in the lungs suddenly get swollen. The condition can make it hard to breathe. It can also cause these symptoms:  A cough.  Coughing up clear, yellow, or green mucus.  Wheezing.  Chest congestion.  Shortness of breath.  A fever.  Body aches.  Chills.  A sore throat.  Follow these instructions at home: Medicines  Take over-the-counter and prescription medicines only as told by your doctor.  If you were prescribed an antibiotic medicine, take it as told by your doctor. Do not stop taking the antibiotic even if you start to feel  better. General instructions  Rest.  Drink enough fluids to keep your pee (urine) clear or pale yellow.  Avoid smoking and secondhand smoke. If you smoke and you need help quitting, ask your doctor. Quitting will help your lungs heal faster.  Use an inhaler, cool mist vaporizer, or humidifier as told by your doctor.  Keep all follow-up visits as told by your doctor. This is important. How is this prevented? To lower your risk of getting this condition again:  Wash your hands often with soap and water. If you cannot use soap and water, use hand sanitizer.  Avoid contact with people who have cold symptoms.  Try not to touch your hands to your mouth, nose, or eyes.  Make sure to get the flu shot every year.  Contact a doctor if:  Your symptoms do not get better in 2 weeks. Get help right away if:  You cough up blood.  You have chest pain.  You have very bad shortness of breath.  You become dehydrated.  You faint (pass out) or keep feeling like you are going to pass out.  You keep throwing up (vomiting).  You have a very bad headache.  Your fever or chills gets worse. This information is not intended to replace advice given to you by your health care provider. Make sure you discuss any questions you have with your health care provider. Document Released: 12/17/2007 Document Revised: 02/06/2016 Document Reviewed: 12/19/2015 Elsevier Interactive Patient Education  2018 [place] [employer].

## 2017-12-11 IMAGING — DX DG FOOT COMPLETE 3+V*R*
3 series · 3 of 3 positions shown · non-contrast
Comparison: No recent prior .

CLINICAL DATA: Pain.

EXAM:
RIGHT FOOT COMPLETE - 3+ VIEW

[foot ap]
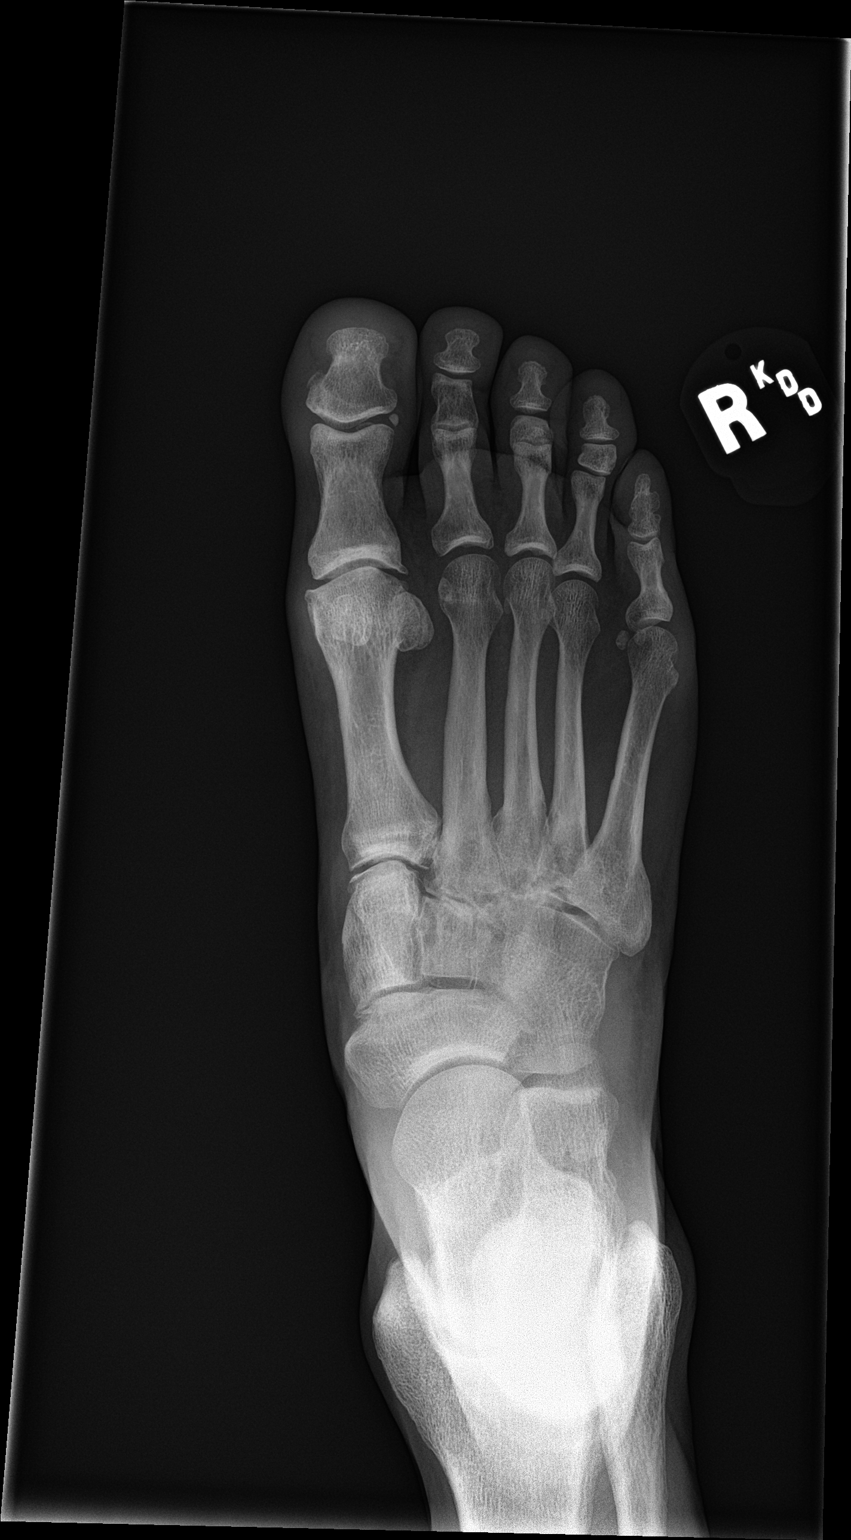

[foot obl]
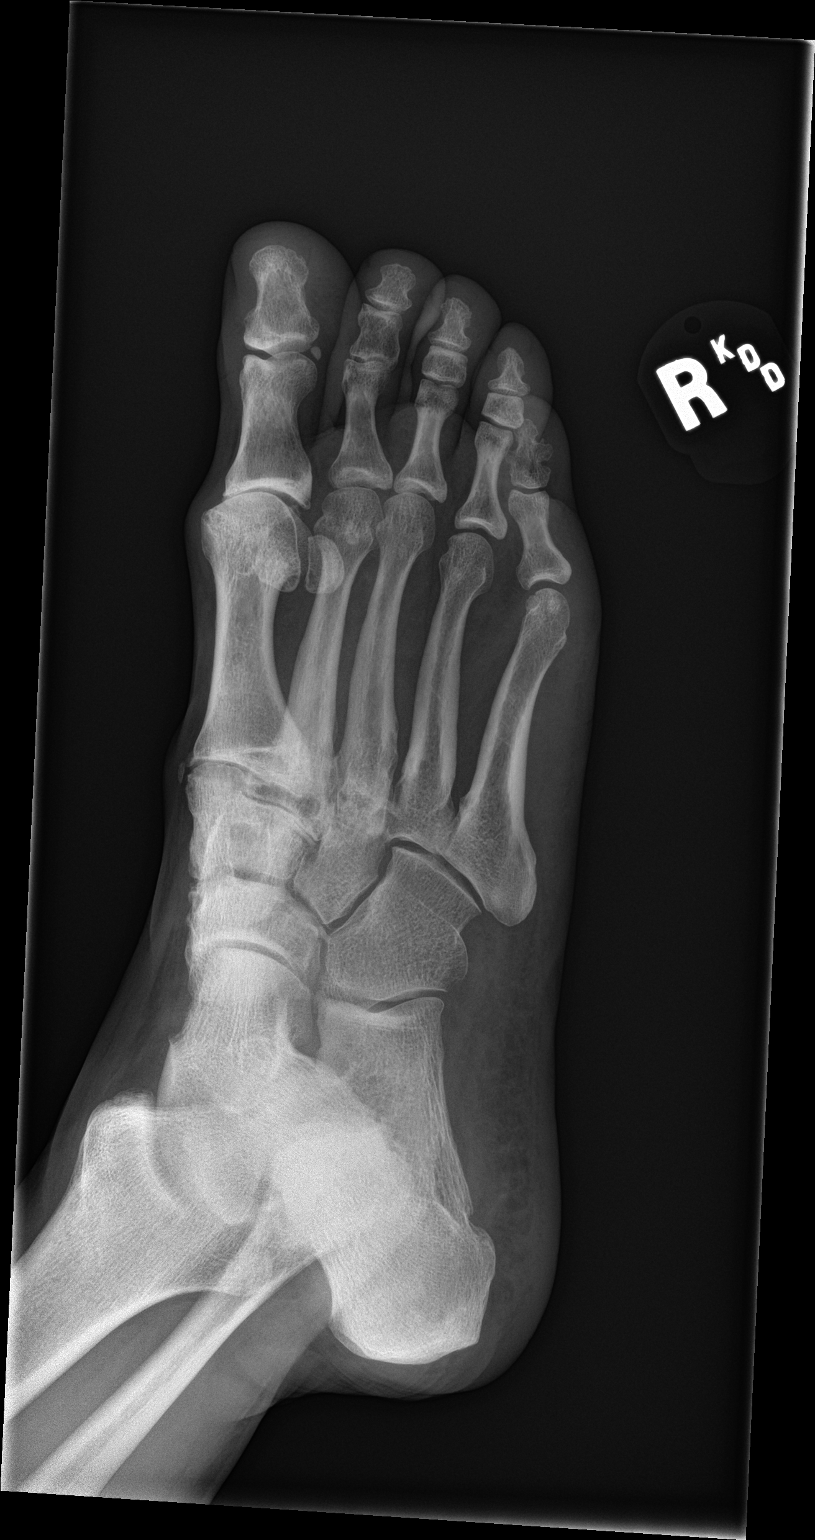

[foot lat]
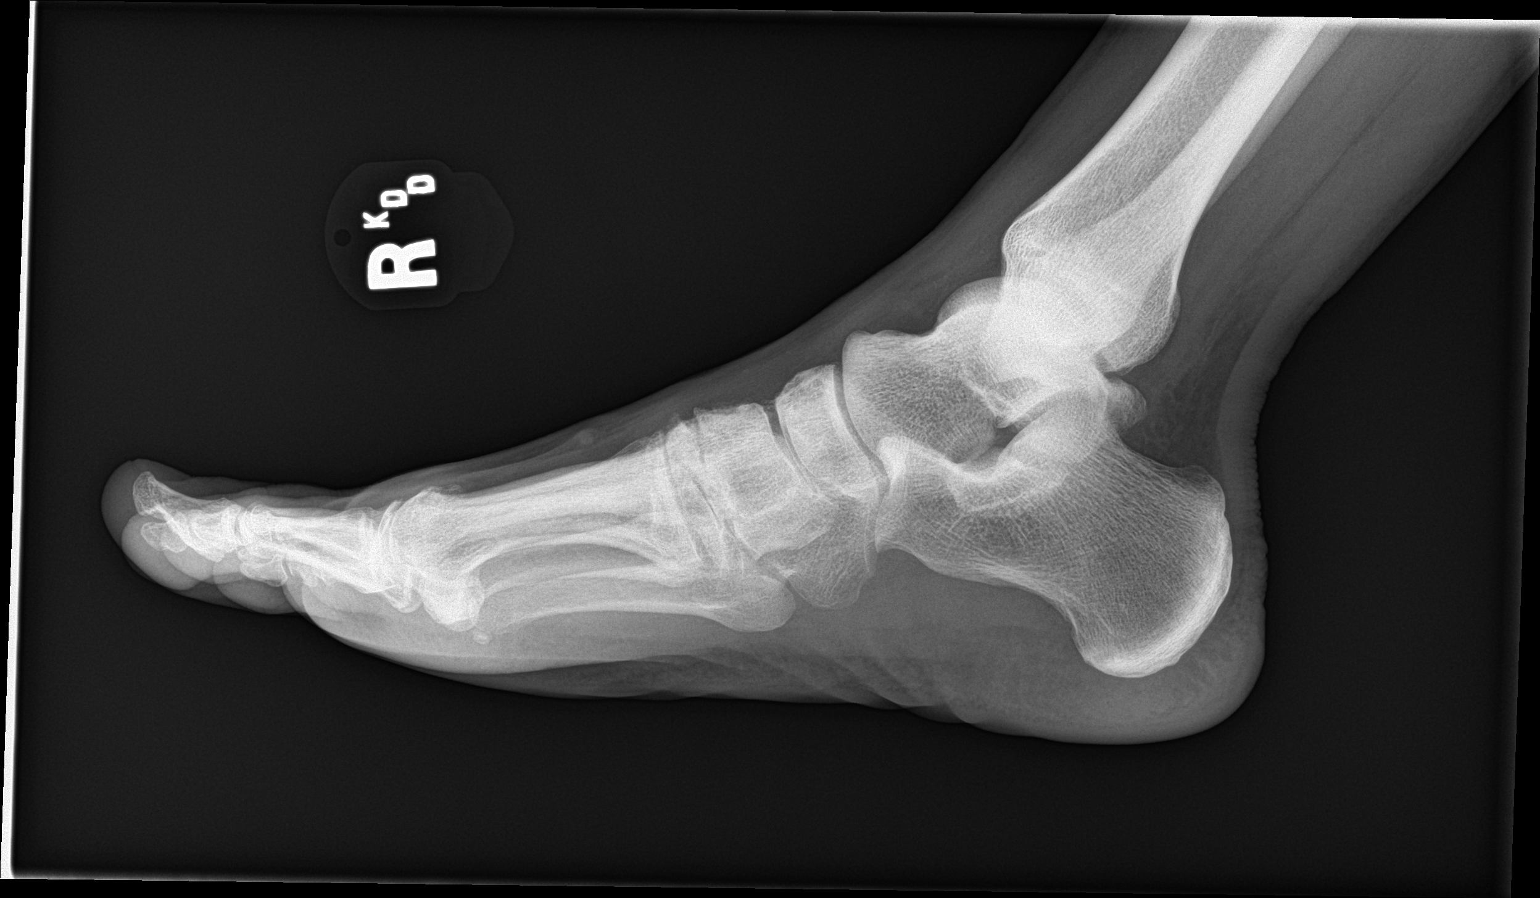

[3 of 3 positions shown; findings below may reference images not displayed]

FINDINGS: Diffuse degenerative change. No evidence of fracture or dislocation.
IMPRESSION: No acute abnormality.

## 2018-01-11 ENCOUNTER — Encounter: Payer: Self-pay | Admitting: Family Medicine

## 2018-01-11 ENCOUNTER — Ambulatory Visit (INDEPENDENT_AMBULATORY_CARE_PROVIDER_SITE_OTHER): Payer: 59 | Admitting: Family Medicine

## 2018-01-11 VITALS — BP 130/82 | HR 74 | Temp 97.2°F | Ht 68.0 in | Wt 185.0 lb

## 2018-01-11 DIAGNOSIS — Z0000 Encounter for general adult medical examination without abnormal findings: Secondary | ICD-10-CM

## 2018-01-11 DIAGNOSIS — E782 Mixed hyperlipidemia: Secondary | ICD-10-CM

## 2018-01-11 DIAGNOSIS — N400 Enlarged prostate without lower urinary tract symptoms: Secondary | ICD-10-CM

## 2018-01-11 DIAGNOSIS — Z23 Encounter for immunization: Secondary | ICD-10-CM | POA: Diagnosis not present

## 2018-01-11 DIAGNOSIS — M7741 Metatarsalgia, right foot: Secondary | ICD-10-CM

## 2018-01-11 LAB — MICROSCOPIC EXAMINATION
BACTERIA UA: NONE SEEN
Epithelial Cells (non renal): NONE SEEN /hpf (ref 0–10)
RBC, UA: NONE SEEN /hpf (ref 0–2)
Renal Epithel, UA: NONE SEEN /hpf
WBC, UA: NONE SEEN /hpf (ref 0–5)

## 2018-01-11 LAB — URINALYSIS, COMPLETE
BILIRUBIN UA: NEGATIVE
GLUCOSE, UA: NEGATIVE
Ketones, UA: NEGATIVE
LEUKOCYTES UA: NEGATIVE
Nitrite, UA: NEGATIVE
PH UA: 7 (ref 5.0–7.5)
Protein, UA: NEGATIVE
RBC UA: NEGATIVE
Specific Gravity, UA: 1.01 (ref 1.005–1.030)
Urobilinogen, Ur: 0.2 mg/dL (ref 0.2–1.0)

## 2018-01-11 NOTE — Patient Instructions (Addendum)
Continue current medications. Continue good therapeutic lifestyle changes which include good diet and exercise. Fall precautions discussed with patient. If an FOBT was given today- please return it to our front desk. If you are over 53 years old - you may need Prevnar 42 or the adult Pneumonia vaccine.  **Flu shots are available--- please call and schedule a FLU-CLINIC appointment**  After your visit with [place] today you will receive a survey in the mail or online from [employer] regarding your care with [place]. Please take a moment to fill this out. Your feedback is very important to [place] as you can help [place] better understand your patient needs as well as improve your experience and satisfaction. WE CARE ABOUT YOU!!!   Please schedule yourself to get an eye exam and make sure that we get a copy of this report by fax Stay active physically protect your foot, eat healthy and drink lots of water This time, if the cholesterol remains elevated we will need to start a statin drug.  This is very important.

## 2018-01-11 NOTE — Progress Notes (Signed)
Subjective:    Patient ID: [name], male    DOB: 1966/02/11, 52 y.o.   MRN: [id number]  HPI Patient is here today for annual wellness exam and follow up of chronic medical problems which includes hyperlipidemia. He is taking medication regularly.  Patient is doing well and today only complains of some right ankle pain for which he wears a brace.  He is due for complete physical today and the biggest problems this young man has are getting his cholesterol under control.  Over the past year he has had community-acquired pneumonia he has had a stress test that was normal and a colonoscopy where they were found to colon polyps.  This was done by Dr. Hilarie Fredrickson.  The patient denies any chest pain pressure tightness or shortness of breath.  He denies any trouble with swallowing heartburn indigestion nausea vomiting diarrhea blood in the stool or black tarry bowel movements.  He is passing his water without problems he has not had an eye exam in all long time and he will schedule this with his wife's eye doctor in [place].  He is having some problems with the plantar surface of the right foot and wears a brace on this and prefers not to have to go get any kind of surgical procedure currently.  As long as he wears a brace during the day he has no pain.  He is pleasant and in good spirits both of his parents are still living he has 1 sister who is younger and everybody's help has been stable.    Patient Active Problem List   Diagnosis Date Noted  . Pes planus 07/04/2016  . Palpitations 01/05/2013  . Metatarsalgia of right foot 12/03/2011   Outpatient Encounter Medications as of 01/11/2018  Medication Sig  . co-enzyme Q-10 30 MG capsule Take 30 mg by mouth 3 (three) times daily.  . Multiple Vitamin (MULTIVITAMIN) tablet Take 1 tablet by mouth daily.  . Omega-3 Fatty Acids (FISH OIL OMEGA-3 PO) Take by mouth.  . Red Yeast Rice Extract (RED YEAST RICE PO) Take by mouth.  . [DISCONTINUED]  azithromycin (ZITHROMAX Z-PAK) 250 MG tablet As directed  . [DISCONTINUED] benzonatate (TESSALON) 100 MG capsule Take 1 capsule (100 mg total) by mouth 3 (three) times daily as needed for cough.   No facility-administered encounter medications on file as of 01/11/2018.      Review of Systems  Constitutional: Negative.   HENT: Negative.   Eyes: Negative.   Respiratory: Negative.   Cardiovascular: Negative.   Gastrointestinal: Negative.   Endocrine: Negative.   Genitourinary: Negative.   Musculoskeletal: Positive for arthralgias (right ankle pain - wears a brace).  Skin: Negative.   Allergic/Immunologic: Negative.   Neurological: Negative.   Hematological: Negative.   Psychiatric/Behavioral: Negative.        Objective:   Physical Exam  Constitutional: He is oriented to person, place, and time. He appears well-developed and well-nourished.  Pleasant and alert  HENT:  Head: Normocephalic and atraumatic.  Right Ear: External ear normal.  Left Ear: External ear normal.  Nose: Nose normal.  Mouth/Throat: Oropharynx is clear and moist. No oropharyngeal exudate.  TMs were normal throat was normal nasal passages were clear  Eyes: Pupils are equal, round, and reactive to light. Conjunctivae and EOM are normal. Right eye exhibits no discharge. Left eye exhibits no discharge. No scleral icterus.  Neck: Normal range of motion. Neck supple. No thyromegaly present.  No bruits thyromegaly or anterior cervical adenopathy  Cardiovascular: Normal rate, regular rhythm, normal heart sounds and intact distal pulses.  No murmur heard. Heart is regular at 72/min  Pulmonary/Chest: Effort normal and breath sounds normal. No respiratory distress. He has no wheezes. He has no rales. He exhibits no tenderness.  Lungs are clear anteriorly and posteriorly with no axillary adenopathy or chest wall masses or tenderness  Abdominal: Soft. Bowel sounds are normal. He exhibits no mass. There is no tenderness.  There is no rebound.  The abdomen is soft without liver or spleen enlargement organ enlargement bruits or inguinal adenopathy  Genitourinary: Rectum normal and penis normal.  Genitourinary Comments: The prostate is enlarged but soft and smooth.  There were no rectal masses.  The right testicle is missing secondary to previous surgery.  There were no inguinal hernias palpable.  Musculoskeletal: Normal range of motion. He exhibits no edema or tenderness.  No tenderness to the plantar surface of the right foot.  No swelling in the ankle.  Good mobility of all extremities.  Lymphadenopathy:    He has no cervical adenopathy.  Neurological: He is alert and oriented to person, place, and time. He has normal reflexes. No cranial nerve deficit.  Skin: Skin is warm and dry. No rash noted.  Psychiatric: He has a normal mood and affect. His behavior is normal. Judgment and thought content normal.  The patient's mood affect and behavior were normal for him.  Nursing note and vitals reviewed.  BP 130/82   Pulse 74   Temp (!) 97.2 F (36.2 C) (Oral)   Ht _0  (1.727 m)   Wt 185 lb (83.9 kg)   BMI 28.13 kg/m         Assessment & Plan:  1. Annual physical exam -The patient is up-to-date on his colonoscopy.  His next will be due in 2023. -He recently had a stress test and this was about a year ago and this was normal. - BMP8+EGFR - CBC with Differential/Platelet - Lipid panel - VITAMIN D 25 Hydroxy (Vit-D Deficiency, Fractures) - Hepatic function panel - PSA, total and free - Urinalysis, Complete  2. Mixed hyperlipidemia -The patient has had hyperlipidemia for several years.  We discussed this important fact that it is important to get this down and that we will seriously look at starting medication if his numbers remain high.  He should continue with as aggressive therapeutic lifestyle changes as possible - CBC with Differential/Platelet - Lipid panel - Hepatic function panel  3. Benign  prostatic hyperplasia, unspecified whether lower urinary tract symptoms present -Patient does have an enlarged prostate but no symptoms associated with this.  There is no erectile dysfunction issues.  There is no leaking or incontinence or burning with voiding. - CBC with Differential/Platelet - PSA, total and free - Urinalysis, Complete  4. Metatarsalgia of right foot -This is being self treated currently and patient does not want to pursue any kind of surgical evaluation because he is controlling it very well with the brace that he wears on his foot.  It does not hurt him a lot but just gives him some uncomfortableness at times.  Patient Instructions  Continue current medications. Continue good therapeutic lifestyle changes which include good diet and exercise. Fall precautions discussed with patient. If an FOBT was given today- please return it to our front desk. If you are over 73 years old - you may need Prevnar 23 or the adult Pneumonia vaccine.  **Flu shots are available--- please call and schedule a FLU-CLINIC appointment**  After your visit with [place] today you will receive a survey in the mail or online from [employer] regarding your care with [place]. Please take a moment to fill this out. Your feedback is very important to [place] as you can help [place] better understand your patient needs as well as improve your experience and satisfaction. WE CARE ABOUT YOU!!!   Please schedule yourself to get an eye exam and make sure that we get a copy of this report by fax Stay active physically protect your foot, eat healthy and drink lots of water This time, if the cholesterol remains elevated we will need to start a statin drug.  This is very important.  Arrie Senate MD

## 2018-01-12 LAB — HEPATIC FUNCTION PANEL
ALBUMIN: 4.6 g/dL (ref 3.5–5.5)
ALK PHOS: 66 IU/L (ref 39–117)
ALT: 41 IU/L (ref 0–44)
AST: 36 IU/L (ref 0–40)
Bilirubin Total: 0.4 mg/dL (ref 0.0–1.2)
Bilirubin, Direct: 0.14 mg/dL (ref 0.00–0.40)
TOTAL PROTEIN: 6.7 g/dL (ref 6.0–8.5)

## 2018-01-12 LAB — CBC WITH DIFFERENTIAL/PLATELET
Basophils Absolute: 0 10*3/uL (ref 0.0–0.2)
Basos: 1 %
EOS (ABSOLUTE): 0.2 10*3/uL (ref 0.0–0.4)
EOS: 4 %
HEMATOCRIT: 45.2 % (ref 37.5–51.0)
HEMOGLOBIN: 15.5 g/dL (ref 13.0–17.7)
Immature Grans (Abs): 0 10*3/uL (ref 0.0–0.1)
Immature Granulocytes: 0 %
LYMPHS ABS: 2.8 10*3/uL (ref 0.7–3.1)
Lymphs: 46 %
MCH: 31.8 pg (ref 26.6–33.0)
MCHC: 34.3 g/dL (ref 31.5–35.7)
MCV: 93 fL (ref 79–97)
MONOCYTES: 6 %
MONOS ABS: 0.4 10*3/uL (ref 0.1–0.9)
Neutrophils Absolute: 2.6 10*3/uL (ref 1.4–7.0)
Neutrophils: 43 %
Platelets: 262 10*3/uL (ref 150–450)
RBC: 4.88 x10E6/uL (ref 4.14–5.80)
RDW: 13.6 % (ref 12.3–15.4)
WBC: 6.1 10*3/uL (ref 3.4–10.8)

## 2018-01-12 LAB — VITAMIN D 25 HYDROXY (VIT D DEFICIENCY, FRACTURES): VIT D 25 HYDROXY: 71.9 ng/mL (ref 30.0–100.0)

## 2018-01-12 LAB — BMP8+EGFR
BUN / CREAT RATIO: 12 (ref 9–20)
BUN: 13 mg/dL (ref 6–24)
CALCIUM: 9.2 mg/dL (ref 8.7–10.2)
CO2: 23 mmol/L (ref 20–29)
CREATININE: 1.08 mg/dL (ref 0.76–1.27)
Chloride: 102 mmol/L (ref 96–106)
GFR calc Af Amer: 90 mL/min/{1.73_m2} (ref >59)
GFR calc non Af Amer: 78 mL/min/{1.73_m2} (ref >59)
Glucose: 97 mg/dL (ref 65–99)
Potassium: 4.1 mmol/L (ref 3.5–5.2)
SODIUM: 140 mmol/L (ref 134–144)

## 2018-01-12 LAB — LIPID PANEL
CHOL/HDL RATIO: 2.9 ratio (ref 0.0–5.0)
Cholesterol, Total: 210 mg/dL — ABNORMAL HIGH (ref 100–199)
HDL: 72 mg/dL (ref >39)
LDL CALC: 125 mg/dL — AB (ref 0–99)
TRIGLYCERIDES: 65 mg/dL (ref 0–149)
VLDL Cholesterol Cal: 13 mg/dL (ref 5–40)

## 2018-01-12 LAB — PSA, TOTAL AND FREE
PROSTATE SPECIFIC AG, SERUM: 1.3 ng/mL (ref 0.0–4.0)
PSA FREE PCT: 24.6 %
PSA FREE: 0.32 ng/mL

## 2018-01-19 ENCOUNTER — Other Ambulatory Visit: Payer: Self-pay | Admitting: *Deleted

## 2018-01-19 MED ORDER — ROSUVASTATIN CALCIUM 5 MG PO TABS: ORAL_TABLET | ORAL | 3 refills | Status: DC

## 2018-02-09 ENCOUNTER — Other Ambulatory Visit: Payer: 59

## 2018-02-09 DIAGNOSIS — Z1211 Encounter for screening for malignant neoplasm of colon: Secondary | ICD-10-CM

## 2018-02-11 LAB — FECAL OCCULT BLOOD, IMMUNOCHEMICAL: FECAL OCCULT BLD: NEGATIVE

## 2018-05-12 ENCOUNTER — Other Ambulatory Visit: Payer: Self-pay | Admitting: *Deleted

## 2018-05-12 DIAGNOSIS — M79671 Pain in right foot: Secondary | ICD-10-CM

## 2018-06-07 ENCOUNTER — Other Ambulatory Visit: Payer: Self-pay | Admitting: *Deleted

## 2018-06-07 ENCOUNTER — Telehealth: Payer: Self-pay | Admitting: *Deleted

## 2018-06-07 ENCOUNTER — Other Ambulatory Visit: Payer: 59

## 2018-06-07 ENCOUNTER — Ambulatory Visit: Payer: 59 | Admitting: Family Medicine

## 2018-06-07 DIAGNOSIS — R300 Dysuria: Secondary | ICD-10-CM

## 2018-06-07 LAB — URINALYSIS, COMPLETE
BILIRUBIN UA: NEGATIVE
Glucose, UA: NEGATIVE
KETONES UA: NEGATIVE
LEUKOCYTES UA: NEGATIVE
Nitrite, UA: NEGATIVE
PROTEIN UA: NEGATIVE
RBC UA: NEGATIVE
Specific Gravity, UA: <1.005 — ABNORMAL LOW (ref 1.005–1.030)
UUROB: 0.2 mg/dL (ref 0.2–1.0)
pH, UA: 6.5 (ref 5.0–7.5)

## 2018-06-07 LAB — MICROSCOPIC EXAMINATION
Bacteria, UA: NONE SEEN
Epithelial Cells (non renal): NONE SEEN /hpf (ref 0–10)
RBC, UA: NONE SEEN /hpf (ref 0–2)
Renal Epithel, UA: NONE SEEN /hpf
WBC, UA: NONE SEEN /hpf (ref 0–5)

## 2018-06-07 MED ORDER — SULFAMETHOXAZOLE-TRIMETHOPRIM 800-160 MG PO TABS: 1.0000 | ORAL_TABLET | Freq: Two times a day (BID) | ORAL | 0 refills | Status: DC

## 2018-06-07 NOTE — Progress Notes (Signed)
Per [facility] - call in meds and have pt leave a urine = aware

## 2018-06-07 NOTE — Telephone Encounter (Signed)
appt made

## 2018-06-08 LAB — URINE CULTURE: ORGANISM ID, BACTERIA: NO GROWTH

## 2018-07-08 ENCOUNTER — Ambulatory Visit: Payer: 59 | Admitting: Family Medicine

## 2018-07-08 ENCOUNTER — Encounter: Payer: Self-pay | Admitting: Family

## 2018-07-08 ENCOUNTER — Ambulatory Visit: Payer: 59 | Admitting: Family

## 2018-07-08 VITALS — BP 115/73 | HR 92 | Temp 97.0°F | Ht 68.0 in | Wt 175.4 lb

## 2018-07-08 DIAGNOSIS — B9689 Other specified bacterial agents as the cause of diseases classified elsewhere: Secondary | ICD-10-CM

## 2018-07-08 DIAGNOSIS — J208 Acute bronchitis due to other specified organisms: Secondary | ICD-10-CM

## 2018-07-08 MED ORDER — AZITHROMYCIN 250 MG PO TABS: ORAL_TABLET | ORAL | 0 refills | Status: DC

## 2018-07-08 MED ORDER — BENZONATATE 200 MG PO CAPS: 200.0000 mg | ORAL_CAPSULE | Freq: Three times a day (TID) | ORAL | 1 refills | Status: DC | PRN

## 2018-07-08 MED ORDER — PREDNISONE 10 MG (21) PO TBPK: ORAL_TABLET | ORAL | 0 refills | Status: DC

## 2018-07-08 NOTE — Progress Notes (Signed)
Subjective:    Patient ID: [name], male    DOB: Oct 01, 1965, 52 y.o.   MRN: [id number]  Chief Complaint  Patient presents with  . Cough    chest congestion for 13 days    Cough  This is a new problem. The current episode started 1 to 4 weeks ago. The problem has been waxing and waning. The problem occurs every few minutes. The cough is non-productive. Associated symptoms include chills, a fever, myalgias, nasal congestion, postnasal drip, a sore throat (mildly), shortness of breath and wheezing. Pertinent negatives include no ear congestion, ear pain, headaches or rhinorrhea. Associated symptoms comments: Fatigue . He has tried rest and OTC cough suppressant for the symptoms. The treatment provided mild relief. There is no history of asthma or COPD.      Review of Systems  Constitutional: Positive for chills and fever.  HENT: Positive for postnasal drip and sore throat (mildly). Negative for ear pain and rhinorrhea.   Respiratory: Positive for cough, shortness of breath and wheezing.   Musculoskeletal: Positive for myalgias.  Neurological: Negative for headaches.  All other systems reviewed and are negative.      Objective:   Physical Exam Vitals signs reviewed.  Constitutional:      General: He is not in acute distress.    Appearance: He is well-developed.  HENT:     Head: Normocephalic.     Right Ear: External ear normal.     Left Ear: External ear normal.  Eyes:     General:        Right eye: No discharge.        Left eye: No discharge.     Pupils: Pupils are equal, round, and reactive to light.  Neck:     Musculoskeletal: Normal range of motion and neck supple.     Thyroid: No thyromegaly.  Cardiovascular:     Rate and Rhythm: Normal rate and regular rhythm.     Heart sounds: Normal heart sounds. No murmur.  Pulmonary:     Effort: Pulmonary effort is normal. No respiratory distress.     Breath sounds: Normal breath sounds. No wheezing.     Comments:  Intermittent nonproductive cough Abdominal:     General: Bowel sounds are normal. There is no distension.     Palpations: Abdomen is soft.     Tenderness: There is no abdominal tenderness.  Musculoskeletal: Normal range of motion.        General: No tenderness.  Skin:    General: Skin is warm and dry.     Findings: No erythema or rash.  Neurological:     Mental Status: He is alert and oriented to person, place, and time.     Cranial Nerves: No cranial nerve deficit.     Deep Tendon Reflexes: Reflexes are normal and symmetric.  Psychiatric:        Behavior: Behavior normal.        Thought Content: Thought content normal.        Judgment: Judgment normal.       BP 115/73   Pulse 92   Temp (!) 97 F (36.1 C) (Oral)   Ht 5\' 8"  (1.727 m)   Wt 175 lb 6.4 oz (79.6 kg)   BMI 26.67 kg/m      Assessment & Plan:  Lucas W Hendryx comes in today with chief complaint of Cough (chest congestion for 13 days)   Diagnosis and orders addressed:  1. Acute bacterial bronchitis -  Take meds as prescribed - Use a cool mist humidifier  -Use saline nose sprays frequently -Force fluids -For any cough or congestion  Use plain Mucinex- regular strength or max strength is fine -For fever or aces or pains- take tylenol or ibuprofen. -Throat lozenges if help -RTO if symptoms worsen or do not improve  - azithromycin (ZITHROMAX) 250 MG tablet; Take 500 mg once, then 250 mg for four days  Dispense: 6 tablet; Refill: 0 - predniSONE (STERAPRED UNI-PAK 21 TAB) 10 MG (21) TBPK tablet; Use as directed  Dispense: 21 tablet; Refill: 0 - benzonatate (TESSALON) 200 MG capsule; Take 1 capsule (200 mg total) by mouth 3 (three) times daily as needed.  Dispense: 30 capsule; Refill: [place], FNP

## 2018-07-08 NOTE — Patient Instructions (Signed)

## 2018-08-06 ENCOUNTER — Other Ambulatory Visit: Payer: Self-pay | Admitting: Family

## 2018-08-06 MED ORDER — PREDNISONE 10 MG (21) PO TBPK: ORAL_TABLET | ORAL | 0 refills | Status: DC

## 2019-01-19 ENCOUNTER — Ambulatory Visit: Payer: 59 | Admitting: Family Medicine

## 2019-01-27 ENCOUNTER — Other Ambulatory Visit: Payer: Self-pay | Admitting: *Deleted

## 2019-01-27 MED ORDER — ROSUVASTATIN CALCIUM 5 MG PO TABS: ORAL_TABLET | ORAL | 0 refills | Status: DC

## 2019-03-02 ENCOUNTER — Encounter: Payer: 59 | Admitting: Family Medicine

## 2019-03-25 ENCOUNTER — Other Ambulatory Visit: Payer: Self-pay

## 2019-03-25 ENCOUNTER — Ambulatory Visit (INDEPENDENT_AMBULATORY_CARE_PROVIDER_SITE_OTHER): Payer: 59 | Admitting: Family Medicine

## 2019-03-25 ENCOUNTER — Encounter: Payer: Self-pay | Admitting: Family Medicine

## 2019-03-25 VITALS — BP 145/79 | HR 58 | Temp 98.7°F | Ht 68.0 in | Wt 177.4 lb

## 2019-03-25 DIAGNOSIS — Z0000 Encounter for general adult medical examination without abnormal findings: Secondary | ICD-10-CM

## 2019-03-25 DIAGNOSIS — Z0001 Encounter for general adult medical examination with abnormal findings: Secondary | ICD-10-CM

## 2019-03-25 DIAGNOSIS — Z125 Encounter for screening for malignant neoplasm of prostate: Secondary | ICD-10-CM

## 2019-03-25 DIAGNOSIS — E782 Mixed hyperlipidemia: Secondary | ICD-10-CM

## 2019-03-25 MED ORDER — ROSUVASTATIN CALCIUM 5 MG PO TABS: ORAL_TABLET | ORAL | 3 refills | Status: DC

## 2019-03-25 MED ORDER — MELOXICAM 15 MG PO TABS: 15.0000 mg | ORAL_TABLET | Freq: Every day | ORAL | 2 refills | Status: DC

## 2019-03-25 NOTE — Progress Notes (Signed)
BP (!) 145/79   Pulse (!) 58   Temp 98.7 F (37.1 C) (Temporal)   Ht 5' 8"  (1.727 m)   Wt 177 lb 6.4 oz (80.5 kg)   SpO2 93%   BMI 26.97 kg/m    Subjective:   Patient ID: [name], male    DOB: 1965-07-23, 53 y.o.   MRN: [id number]  HPI: [name] is a 53 y.o. male presenting on 03/25/2019 for Annual Exam   HPI Adult well exam Patient is coming in for well adult exam and physical today.  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  Relevant past medical, surgical, family and social history reviewed and updated as indicated. Interim medical history since our last visit reviewed. Allergies and medications reviewed and updated.  Review of Systems  Constitutional: Negative for chills and fever.  HENT: Negative for ear pain and tinnitus.   Eyes: Negative for pain and discharge.  Respiratory: Negative for cough, shortness of breath and wheezing.   Cardiovascular: Negative for chest pain, palpitations and leg swelling.  Gastrointestinal: Negative for abdominal pain, blood in stool, constipation and diarrhea.  Genitourinary: Negative for dysuria and hematuria.  Musculoskeletal: Negative for back pain, gait problem and myalgias.  Skin: Negative for rash.  Neurological: Negative for dizziness, weakness and headaches.  Psychiatric/Behavioral: Negative for suicidal ideas.  All other systems reviewed and are negative.   Per HPI unless specifically indicated above   Allergies as of 03/25/2019   No Known Allergies     Medication List       Accurate as of March 25, 2019  1:48 PM. If you have any questions, ask your nurse or doctor.        STOP taking  these medications   azithromycin 250 MG tablet Commonly known as: ZITHROMAX Stopped by: Fransisca Kaufmann Dettinger, MD   benzonatate 200 MG capsule Commonly known as: TESSALON Stopped by: Fransisca Kaufmann Dettinger, MD   predniSONE 10 MG (21) Tbpk tablet Commonly known as: STERAPRED UNI-PAK 21 TAB Stopped by: Fransisca Kaufmann Dettinger, MD     TAKE these medications   co-enzyme Q-10 30 MG capsule Take 30 mg by mouth 3 (three) times daily.   FISH OIL OMEGA-3 PO Take by mouth.   multivitamin tablet Take 1 tablet by mouth daily.   RED YEAST RICE PO Take by mouth.   rosuvastatin 5 MG tablet Commonly known as: Crestor Take 1 tablet on Monday, Wednesdays and Fridays at Bedtime        Objective:   BP (!) 145/79   Pulse (!) 58   Temp 98.7 F (37.1 C) (Temporal)   Ht 5' 8"  (1.727 m)   Wt 177 lb 6.4 oz (80.5 kg)   SpO2 93%   BMI 26.97 kg/m   Wt Readings from Last 3 Encounters:  03/25/19 177 lb 6.4 oz (80.5 kg)  07/08/18 175 lb 6.4 oz (79.6 kg)  01/11/18 185 lb (83.9 kg)    Physical Exam Vitals signs and nursing note reviewed.  Constitutional:      General: He is not in acute distress.    Appearance: He is well-developed. He  is not diaphoretic.  HENT:     Right Ear: External ear normal.     Left Ear: External ear normal.     Nose: Nose normal.     Mouth/Throat:     Pharynx: No oropharyngeal exudate.  Eyes:     General: No scleral icterus.       Right eye: No discharge.     Conjunctiva/sclera: Conjunctivae normal.     Pupils: Pupils are equal, round, and reactive to light.  Neck:     Musculoskeletal: Neck supple.     Thyroid: No thyromegaly.  Cardiovascular:     Rate and Rhythm: Normal rate and regular rhythm.     Heart sounds: Normal heart sounds. No murmur.  Pulmonary:     Effort: Pulmonary effort is normal. No respiratory distress.     Breath sounds: Normal breath sounds. No wheezing.  Abdominal:     General: Bowel sounds are normal. There is no distension.      Palpations: Abdomen is soft.     Tenderness: There is no abdominal tenderness. There is no guarding or rebound.  Genitourinary:    Prostate: Enlarged. Not tender and no nodules present.  Musculoskeletal: Normal range of motion.  Lymphadenopathy:     Cervical: No cervical adenopathy.  Skin:    General: Skin is warm and dry.     Findings: No rash.  Neurological:     Mental Status: He is alert and oriented to person, place, and time.     Coordination: Coordination normal.  Psychiatric:        Behavior: Behavior normal.       Assessment & Plan:   Problem List Items Addressed This Visit    None    Visit Diagnoses    Well adult exam    -  Primary   Relevant Orders   CBC with Differential/Platelet   CMP14+EGFR   Lipid panel   PSA, total and free   Mixed hyperlipidemia       Relevant Medications   rosuvastatin (CRESTOR) 5 MG tablet   Other Relevant Orders   Lipid panel   Prostate cancer screening       Relevant Orders   PSA, total and free      Continue Crestor and will check blood work today. Follow up plan: Return in about 1 year (around 03/24/2020), or if symptoms worsen or fail to improve, for Well adult exam.  Counseling provided for all of the vaccine components No orders of the defined types were placed in this encounter.   Caryl Pina, MD [facility] 03/25/2019, 1:48 PM

## 2019-03-26 LAB — LIPID PANEL
Chol/HDL Ratio: 2.2 ratio (ref 0.0–5.0)
Cholesterol, Total: 177 mg/dL (ref 100–199)
HDL: 81 mg/dL (ref >39)
LDL Chol Calc (NIH): 85 mg/dL (ref 0–99)
Triglycerides: 53 mg/dL (ref 0–149)
VLDL Cholesterol Cal: 11 mg/dL (ref 5–40)

## 2019-03-26 LAB — CBC WITH DIFFERENTIAL/PLATELET
Basophils Absolute: 0.1 10*3/uL (ref 0.0–0.2)
Basos: 1 %
EOS (ABSOLUTE): 0.1 10*3/uL (ref 0.0–0.4)
Eos: 1 %
Hematocrit: 42.1 % (ref 37.5–51.0)
Hemoglobin: 14.5 g/dL (ref 13.0–17.7)
Immature Grans (Abs): 0 10*3/uL (ref 0.0–0.1)
Immature Granulocytes: 0 %
Lymphocytes Absolute: 3 10*3/uL (ref 0.7–3.1)
Lymphs: 41 %
MCH: 30.9 pg (ref 26.6–33.0)
MCHC: 34.4 g/dL (ref 31.5–35.7)
MCV: 90 fL (ref 79–97)
Monocytes Absolute: 0.7 10*3/uL (ref 0.1–0.9)
Monocytes: 9 %
Neutrophils Absolute: 3.5 10*3/uL (ref 1.4–7.0)
Neutrophils: 48 %
Platelets: 260 10*3/uL (ref 150–450)
RBC: 4.69 x10E6/uL (ref 4.14–5.80)
RDW: 12.7 % (ref 11.6–15.4)
WBC: 7.4 10*3/uL (ref 3.4–10.8)

## 2019-03-26 LAB — CMP14+EGFR
ALT: 45 IU/L — ABNORMAL HIGH (ref 0–44)
AST: 73 IU/L — ABNORMAL HIGH (ref 0–40)
Albumin/Globulin Ratio: 1.9 (ref 1.2–2.2)
Albumin: 4.5 g/dL (ref 3.8–4.9)
Alkaline Phosphatase: 68 IU/L (ref 39–117)
BUN/Creatinine Ratio: 12 (ref 9–20)
BUN: 13 mg/dL (ref 6–24)
Bilirubin Total: 0.5 mg/dL (ref 0.0–1.2)
CO2: 25 mmol/L (ref 20–29)
Calcium: 9.7 mg/dL (ref 8.7–10.2)
Chloride: 103 mmol/L (ref 96–106)
Creatinine, Ser: 1.08 mg/dL (ref 0.76–1.27)
GFR calc Af Amer: 89 mL/min/{1.73_m2} (ref >59)
GFR calc non Af Amer: 77 mL/min/{1.73_m2} (ref >59)
Globulin, Total: 2.4 g/dL (ref 1.5–4.5)
Glucose: 86 mg/dL (ref 65–99)
Potassium: 4.2 mmol/L (ref 3.5–5.2)
Sodium: 139 mmol/L (ref 134–144)
Total Protein: 6.9 g/dL (ref 6.0–8.5)

## 2019-03-26 LAB — PSA, TOTAL AND FREE
PSA, Free Pct: 27 %
PSA, Free: 0.27 ng/mL
Prostate Specific Ag, Serum: 1 ng/mL (ref 0.0–4.0)

## 2019-11-11 DIAGNOSIS — Z1152 Encounter for screening for COVID-19: Secondary | ICD-10-CM | POA: Diagnosis not present

## 2019-11-11 DIAGNOSIS — Z03818 Encounter for observation for suspected exposure to other biological agents ruled out: Secondary | ICD-10-CM | POA: Diagnosis not present

## 2020-03-02 DIAGNOSIS — Z23 Encounter for immunization: Secondary | ICD-10-CM | POA: Diagnosis not present

## 2020-03-26 ENCOUNTER — Other Ambulatory Visit: Payer: Self-pay | Admitting: Family Medicine

## 2020-03-26 DIAGNOSIS — E782 Mixed hyperlipidemia: Secondary | ICD-10-CM

## 2020-03-28 ENCOUNTER — Encounter: Payer: Self-pay | Admitting: Family Medicine

## 2020-03-28 ENCOUNTER — Ambulatory Visit (INDEPENDENT_AMBULATORY_CARE_PROVIDER_SITE_OTHER): Payer: 59 | Admitting: Family Medicine

## 2020-03-28 ENCOUNTER — Other Ambulatory Visit: Payer: Self-pay

## 2020-03-28 VITALS — BP 140/76 | HR 72 | Temp 98.6°F | Ht 68.0 in | Wt 174.0 lb

## 2020-03-28 DIAGNOSIS — Z0000 Encounter for general adult medical examination without abnormal findings: Secondary | ICD-10-CM

## 2020-03-28 DIAGNOSIS — E782 Mixed hyperlipidemia: Secondary | ICD-10-CM

## 2020-03-28 MED ORDER — ROSUVASTATIN CALCIUM 5 MG PO TABS: ORAL_TABLET | ORAL | 3 refills | Status: DC

## 2020-03-28 MED ORDER — MELOXICAM 15 MG PO TABS
15.0000 mg | ORAL_TABLET | Freq: Every day | ORAL | 3 refills | Status: DC
Start: 2020-03-28 — End: 2021-04-04

## 2020-03-28 NOTE — Progress Notes (Signed)
BP 140/76   Pulse 72   Temp 98.6 F (37 C)   Ht _0  (1.727 m)   Wt 174 lb (78.9 kg)   SpO2 99%   BMI 26.46 kg/m    Subjective:   Patient ID: [name], male    DOB: 04/16/1966, 54 y.o.   MRN: [id number]  HPI: Lucas W Vallas is a 54 y.o. male presenting on 03/28/2020 for Medical Management of Chronic Issues (CPE)   HPI Well adult exam Patient denies any chest pain, shortness of breath, headaches or vision issues, abdominal complaints, diarrhea, nausea, vomiting.  Patient has had a little bit of upper left abdominal discomfort currently he has started taking omeprazole and is feeling a lot better.  Patient does say he gets the occasional back or knee arthralgias and takes omeprazole and does pretty good with that.  He denies any other major health issues.  Relevant past medical, surgical, family and social history reviewed and updated as indicated. Interim medical history since our last visit reviewed. Allergies and medications reviewed and updated.  Review of Systems  Constitutional: Negative for chills and fever.  HENT: Negative for ear pain and tinnitus.   Eyes: Negative for pain and discharge.  Respiratory: Negative for cough, shortness of breath and wheezing.   Cardiovascular: Negative for chest pain, palpitations and leg swelling.  Gastrointestinal: Negative for abdominal pain, blood in stool, constipation and diarrhea.  Genitourinary: Negative for dysuria and hematuria.  Musculoskeletal: Negative for back pain, gait problem and myalgias.  Skin: Negative for rash.  Neurological: Negative for dizziness, weakness and headaches.  Psychiatric/Behavioral: Negative for suicidal ideas.  All other systems reviewed and are negative.   Per HPI unless specifically indicated above   Allergies as of 03/28/2020   No Known Allergies     Medication List       Accurate as of March 28, 2020  2:44 PM. If you have any questions, ask your nurse or doctor.          co-enzyme Q-10 30 MG capsule Take 30 mg by mouth 3 (three) times daily.   FISH OIL OMEGA-3 PO Take by mouth.   meloxicam 15 MG tablet Commonly known as: MOBIC Take 1 tablet (15 mg total) by mouth daily.   multivitamin tablet Take 1 tablet by mouth daily.   omeprazole 20 MG capsule Commonly known as: PRILOSEC Take 20 mg by mouth daily.   RED YEAST RICE PO Take by mouth.   rosuvastatin 5 MG tablet Commonly known as: CRESTOR TAKE (1) TABLET ON MONDAY, WEDNESDAY AND FRIDAY AT BEDTIME        Objective:   BP 140/76   Pulse 72   Temp 98.6 F (37 C)   Ht _1  (1.727 m)   Wt 174 lb (78.9 kg)   SpO2 99%   BMI 26.46 kg/m   Wt Readings from Last 3 Encounters:  03/28/20 174 lb (78.9 kg)  03/25/19 177 lb 6.4 oz (80.5 kg)  07/08/18 175 lb 6.4 oz (79.6 kg)    Physical Exam Vitals and nursing note reviewed.  Constitutional:      General: He is not in acute distress.    Appearance: He is well-developed. He is not diaphoretic.  HENT:     Right Ear: External ear normal.     Left Ear: External ear normal.     Nose: Nose normal.     Mouth/Throat:     Pharynx: No oropharyngeal exudate.  Eyes:  General: No scleral icterus.       Right eye: No discharge.     Conjunctiva/sclera: Conjunctivae normal.     Pupils: Pupils are equal, round, and reactive to light.  Neck:     Thyroid: No thyromegaly.  Cardiovascular:     Rate and Rhythm: Normal rate and regular rhythm.     Heart sounds: Normal heart sounds. No murmur heard.   Pulmonary:     Effort: Pulmonary effort is normal. No respiratory distress.     Breath sounds: Normal breath sounds. No wheezing.  Abdominal:     General: Bowel sounds are normal. There is no distension.     Palpations: Abdomen is soft.     Tenderness: There is no abdominal tenderness. There is no guarding or rebound.  Musculoskeletal:        General: Normal range of motion.     Cervical back: Neck supple.  Lymphadenopathy:     Cervical: No  cervical adenopathy.  Skin:    General: Skin is warm and dry.     Findings: No rash.  Neurological:     Mental Status: He is alert and oriented to person, place, and time.     Coordination: Coordination normal.  Psychiatric:        Behavior: Behavior normal.       Assessment & Plan:   Problem List Items Addressed This Visit    None    Visit Diagnoses    Well adult exam    -  Primary   Relevant Orders   CBC with Differential/Platelet   CMP14+EGFR   Lipid panel   PSA, total and free   Mixed hyperlipidemia       Relevant Medications   rosuvastatin (CRESTOR) 5 MG tablet   Other Relevant Orders   Lipid panel       Follow up plan: Return in about 1 year (around 03/28/2021), or if symptoms worsen or fail to improve.  Counseling provided for all of the vaccine components No orders of the defined types were placed in this encounter.   Caryl Pina, MD [facility] 03/28/2020, 2:44 PM

## 2020-03-29 LAB — CBC WITH DIFFERENTIAL/PLATELET
Basophils Absolute: 0.1 10*3/uL (ref 0.0–0.2)
Basos: 1 %
EOS (ABSOLUTE): 0.1 10*3/uL (ref 0.0–0.4)
Eos: 2 %
Hematocrit: 44.1 % (ref 37.5–51.0)
Hemoglobin: 15.2 g/dL (ref 13.0–17.7)
Immature Grans (Abs): 0 10*3/uL (ref 0.0–0.1)
Immature Granulocytes: 0 %
Lymphocytes Absolute: 2.8 10*3/uL (ref 0.7–3.1)
Lymphs: 43 %
MCH: 32.3 pg (ref 26.6–33.0)
MCHC: 34.5 g/dL (ref 31.5–35.7)
MCV: 94 fL (ref 79–97)
Monocytes Absolute: 0.5 10*3/uL (ref 0.1–0.9)
Monocytes: 8 %
Neutrophils Absolute: 3 10*3/uL (ref 1.4–7.0)
Neutrophils: 46 %
Platelets: 254 10*3/uL (ref 150–450)
RBC: 4.7 x10E6/uL (ref 4.14–5.80)
RDW: 12.3 % (ref 11.6–15.4)
WBC: 6.4 10*3/uL (ref 3.4–10.8)

## 2020-03-29 LAB — PSA, TOTAL AND FREE
PSA, Free Pct: 21.7 %
PSA, Free: 0.26 ng/mL
Prostate Specific Ag, Serum: 1.2 ng/mL (ref 0.0–4.0)

## 2020-03-29 LAB — CMP14+EGFR
ALT: 28 IU/L (ref 0–44)
AST: 26 IU/L (ref 0–40)
Albumin/Globulin Ratio: 2.2 (ref 1.2–2.2)
Albumin: 4.8 g/dL (ref 3.8–4.9)
Alkaline Phosphatase: 68 IU/L (ref 44–121)
BUN/Creatinine Ratio: 10 (ref 9–20)
BUN: 11 mg/dL (ref 6–24)
Bilirubin Total: 0.4 mg/dL (ref 0.0–1.2)
CO2: 26 mmol/L (ref 20–29)
Calcium: 9.7 mg/dL (ref 8.7–10.2)
Chloride: 101 mmol/L (ref 96–106)
Creatinine, Ser: 1.07 mg/dL (ref 0.76–1.27)
GFR calc Af Amer: 90 mL/min/{1.73_m2} (ref >59)
GFR calc non Af Amer: 78 mL/min/{1.73_m2} (ref >59)
Globulin, Total: 2.2 g/dL (ref 1.5–4.5)
Glucose: 83 mg/dL (ref 65–99)
Potassium: 4.1 mmol/L (ref 3.5–5.2)
Sodium: 139 mmol/L (ref 134–144)
Total Protein: 7 g/dL (ref 6.0–8.5)

## 2020-03-29 LAB — LIPID PANEL
Chol/HDL Ratio: 2.5 ratio (ref 0.0–5.0)
Cholesterol, Total: 194 mg/dL (ref 100–199)
HDL: 77 mg/dL (ref >39)
LDL Chol Calc (NIH): 103 mg/dL — ABNORMAL HIGH (ref 0–99)
Triglycerides: 75 mg/dL (ref 0–149)
VLDL Cholesterol Cal: 14 mg/dL (ref 5–40)

## 2020-07-12 ENCOUNTER — Encounter: Payer: Self-pay | Admitting: *Deleted

## 2020-08-31 ENCOUNTER — Telehealth: Payer: 59 | Admitting: Emergency Medicine

## 2020-08-31 ENCOUNTER — Encounter: Payer: Self-pay | Admitting: Nurse Practitioner

## 2020-08-31 ENCOUNTER — Ambulatory Visit (INDEPENDENT_AMBULATORY_CARE_PROVIDER_SITE_OTHER): Payer: 59 | Admitting: Nurse Practitioner

## 2020-08-31 ENCOUNTER — Encounter: Payer: Self-pay | Admitting: Family Medicine

## 2020-08-31 DIAGNOSIS — R300 Dysuria: Secondary | ICD-10-CM

## 2020-08-31 DIAGNOSIS — R351 Nocturia: Secondary | ICD-10-CM | POA: Diagnosis not present

## 2020-08-31 LAB — URINALYSIS, COMPLETE
Bilirubin, UA: NEGATIVE
Glucose, UA: NEGATIVE
Ketones, UA: NEGATIVE
Leukocytes,UA: NEGATIVE
Nitrite, UA: NEGATIVE
Protein,UA: NEGATIVE
RBC, UA: NEGATIVE
Specific Gravity, UA: 1.01 (ref 1.005–1.030)
Urobilinogen, Ur: 0.2 mg/dL (ref 0.2–1.0)
pH, UA: 5.5 (ref 5.0–7.5)

## 2020-08-31 LAB — MICROSCOPIC EXAMINATION
Epithelial Cells (non renal): NONE SEEN /hpf (ref 0–10)
RBC, Urine: NONE SEEN /hpf (ref 0–2)
WBC, UA: NONE SEEN /hpf (ref 0–5)

## 2020-08-31 NOTE — Progress Notes (Signed)
Based on what you shared with me, I feel your condition warrants further evaluation and I recommend that you be seen for a face to face office visit.  Male bladder infections are not very common.  We worry about prostate or kidney conditions.  The standard of care is to examine the abdomen and kidneys, and to do a urine and blood test to make sure that something more serious is not going on.  We recommend that you see a provider today.  If your doctor's office is closed [facility] has the following Urgent Cares:    NOTE: If you entered your credit card information for this eVisit, you will not be charged. You may see a "hold" on your card for the $35 but that hold will drop off and you will not have a charge processed.   If you are having a true medical emergency please call 911.     For an urgent face to face visit, [facility] has four urgent care centers for your convenience:    NEW:  [facility]gent [place], [place] [address] .  Monday - Friday 10 am - 6 pm    . [facility]    [phone]                  Get Driving Directions  [address] [place], [place] [address] . 10 am to 8 pm Monday-Friday . 12 pm to 8 pm Saturday-Sunday   . [facility] at [place]                  Get Driving Directions  [address], [place], [place] [address] . 8 am to 8 pm Monday-Friday . 9 am to 6 pm Saturday . 11 am to 6 pm Sunday   . [facility] at [place]                  Get Driving Directions   [address] [place], [place] [address] . 8 am to 8 pm Monday-Friday . 8 am to 4 pm Saturday-Sunday    . [facility] at [place]                    Get Driving Directions  [phone]  [address] [place], [place] [address]  . Monday-Friday, 12 PM to 6 PM    Your e-visit  answers were reviewed by a board certified advanced clinical practitioner to complete your personal care plan.  Thank you for using e-Visits.  Approximately 5 minutes was used in reviewing the patient's chart, questionnaire, prescribing medications, and documentation.

## 2020-08-31 NOTE — Progress Notes (Signed)
   Virtual Visit via telephone Note Due to COVID-19 pandemic this visit was conducted virtually. This visit type was conducted due to national recommendations for restrictions regarding the COVID-19 Pandemic (e.g. social distancing, sheltering in place) in an effort to limit this patient's exposure and mitigate transmission in our community. All issues noted in this document were discussed and addressed.  A physical exam was not performed with this format.  I connected with [name] on 08/31/20 at 12:35 by telephone and verified that I am speaking with the correct person using two identifiers. [name] is currently located at home and no one  is currently with him during visit. The provider, Mary-Margaret Hassell Done, FNP is located in their office at time of visit.  I discussed the limitations, risks, security and privacy concerns of performing an evaluation and management service by telephone and the availability of in person appointments. I also discussed with the patient that there may be a patient responsible charge related to this service. The patient expressed understanding and agreed to proceed.   History and Present Illness:   Chief Complaint: Dysuria   HPI Patient does telephone visit with c/o having to go to the restroom at night 2-3 x. Started 3 nights ago. He usually only gets up 1x a night. He was waking up with gerd and started on omeprazole and that has gotten better.  He drinks lots of water throughout the day. No change in strength of stream or starting stream.  * he puts apple cider vinegar in his water  Review of Systems  Cardiovascular: Negative.   Genitourinary: Positive for frequency and urgency. Negative for dysuria, flank pain and hematuria.  Neurological: Negative.   All other systems reviewed and are negative.    Observations/Objective: Alert and oriented- answers all questions appropriately No distress  Lab Results  Component Value Date   PSA1  1.2 03/28/2020   PSA1 1.0 03/25/2019   PSA1 1.3 01/11/2018   PSA 0.92 12/09/2012      Assessment and Plan: [name] in today with chief complaint of Dysuria   1. Dysuria - Urine Culture - Urinalysis, Complete  2. Nocturia Stop apple cider vinegar after 12 noon in the day No liquids 2 hours prior to bedtime. Call if no better    Follow Up Instructions: prn    I discussed the assessment and treatment plan with the patient. The patient was provided an opportunity to ask questions and all were answered. The patient agreed with the plan and demonstrated an understanding of the instructions.   The patient was advised to call back or seek an in-person evaluation if the symptoms worsen or if the condition fails to improve as anticipated.  The above assessment and management plan was discussed with the patient. The patient verbalized understanding of and has agreed to the management plan. Patient is aware to call the clinic if symptoms persist or worsen. Patient is aware when to return to the clinic for a follow-up visit. Patient educated on when it is appropriate to go to the emergency department.   Time call ended:  12:45  I provided 15 minutes of non-face-to-face time during this encounter.    Mary-Margaret Hassell Done, FNP

## 2020-09-02 LAB — URINE CULTURE

## 2021-04-03 ENCOUNTER — Encounter: Payer: 59 | Admitting: Family Medicine

## 2021-04-04 ENCOUNTER — Ambulatory Visit (INDEPENDENT_AMBULATORY_CARE_PROVIDER_SITE_OTHER): Payer: 59 | Admitting: Family Medicine

## 2021-04-04 ENCOUNTER — Encounter: Payer: Self-pay | Admitting: Family Medicine

## 2021-04-04 ENCOUNTER — Other Ambulatory Visit: Payer: Self-pay

## 2021-04-04 VITALS — BP 143/87 | HR 105 | Ht 68.0 in | Wt 169.0 lb

## 2021-04-04 DIAGNOSIS — Z0001 Encounter for general adult medical examination with abnormal findings: Secondary | ICD-10-CM

## 2021-04-04 DIAGNOSIS — M7741 Metatarsalgia, right foot: Secondary | ICD-10-CM

## 2021-04-04 DIAGNOSIS — Z23 Encounter for immunization: Secondary | ICD-10-CM | POA: Diagnosis not present

## 2021-04-04 DIAGNOSIS — E782 Mixed hyperlipidemia: Secondary | ICD-10-CM | POA: Diagnosis not present

## 2021-04-04 DIAGNOSIS — Z0000 Encounter for general adult medical examination without abnormal findings: Secondary | ICD-10-CM | POA: Diagnosis not present

## 2021-04-04 MED ORDER — ROSUVASTATIN CALCIUM 5 MG PO TABS: ORAL_TABLET | ORAL | 3 refills | Status: DC

## 2021-04-04 MED ORDER — MELOXICAM 15 MG PO TABS: 15.0000 mg | ORAL_TABLET | Freq: Every day | ORAL | 3 refills | Status: DC

## 2021-04-04 NOTE — Progress Notes (Signed)
BP (!) 143/87   Pulse (!) 105   Ht 5' 8"  (1.727 m)   Wt 169 lb (76.7 kg)   SpO2 95%   BMI 25.70 kg/m    Subjective:   Patient ID: [name], male    DOB: [birth year], 55 y.o.   MRN: [id number]  HPI: [name] is a 55 y.o. male presenting on 04/04/2021 for Medical Management of Chronic Issues (CPE)   HPI Adult well exam Patient denies any chest pain, shortness of breath, headaches or vision issues, abdominal complaints, diarrhea, nausea, vomiting. Patient does complain right foot/ankle issues.  He says he is developed a flatfoot and then sometimes it will flareup on him and bother him and then sometimes it is calm down.  He says right now its not too bad because he started wearing a brace to help with that but then sometimes it will flareup.  He says this is been off and on and he is considered going to see an orthopedic foot doctor, Dr. Doran Durand but he wants to hold off for now but will consider in the future.  Relevant past medical, surgical, family and social history reviewed and updated as indicated. Interim medical history since our last visit reviewed. Allergies and medications reviewed and updated.  Review of Systems  Constitutional:  Negative for chills and fever.  HENT:  Negative for ear pain and tinnitus.   Eyes:  Negative for pain and visual disturbance.  Respiratory:  Negative for cough, shortness of breath and wheezing.   Cardiovascular:  Negative for chest pain, palpitations and leg swelling.  Gastrointestinal:  Negative for abdominal pain, blood in stool, constipation and diarrhea.  Genitourinary:  Negative for dysuria and hematuria.  Musculoskeletal:  Negative for back pain, gait problem and myalgias.  Skin:  Negative for rash.  Neurological:  Negative for dizziness, weakness, light-headedness and headaches.  Psychiatric/Behavioral:  Negative for suicidal ideas.   All other systems reviewed and are negative.  Per HPI unless specifically indicated  above   Allergies as of 04/04/2021   No Known Allergies      Medication List        Accurate as of April 04, 2021  3:17 PM. If you have any questions, ask your nurse or doctor.          STOP taking these medications    omeprazole 20 MG capsule Commonly known as: PRILOSEC Stopped by: Fransisca Kaufmann Dexter Sauser, MD       TAKE these medications    co-enzyme Q-10 30 MG capsule Take 30 mg by mouth 3 (three) times daily.   FISH OIL OMEGA-3 PO Take by mouth.   meloxicam 15 MG tablet Commonly known as: MOBIC Take 1 tablet (15 mg total) by mouth daily.   multivitamin tablet Take 1 tablet by mouth daily.   RED YEAST RICE PO Take by mouth.   rosuvastatin 5 MG tablet Commonly known as: CRESTOR TAKE (1) TABLET ON MONDAY, WEDNESDAY AND FRIDAY AT BEDTIME         Objective:   BP (!) 143/87   Pulse (!) 105   Ht 5' 8"  (1.727 m)   Wt 169 lb (76.7 kg)   SpO2 95%   BMI 25.70 kg/m   Wt Readings from Last 3 Encounters:  04/04/21 169 lb (76.7 kg)  03/28/20 174 lb (78.9 kg)  03/25/19 177 lb 6.4 oz (80.5 kg)    Physical Exam Vitals reviewed.  Constitutional:      General: He is not  in acute distress.    Appearance: He is well-developed. He is not diaphoretic.  HENT:     Right Ear: External ear normal.     Left Ear: External ear normal.     Nose: Nose normal.     Mouth/Throat:     Pharynx: No oropharyngeal exudate.  Eyes:     General: No scleral icterus.    Extraocular Movements: Extraocular movements intact.     Conjunctiva/sclera: Conjunctivae normal.     Pupils: Pupils are equal, round, and reactive to light.  Neck:     Thyroid: No thyromegaly.  Cardiovascular:     Rate and Rhythm: Normal rate and regular rhythm.     Heart sounds: Normal heart sounds. No murmur heard. Pulmonary:     Effort: Pulmonary effort is normal. No respiratory distress.     Breath sounds: Normal breath sounds. No wheezing.  Abdominal:     General: Bowel sounds are normal. There is  no distension.     Palpations: Abdomen is soft.     Tenderness: There is no abdominal tenderness. There is no guarding or rebound.  Musculoskeletal:        General: Normal range of motion.     Cervical back: Neck supple.  Lymphadenopathy:     Cervical: No cervical adenopathy.  Skin:    General: Skin is warm and dry.     Findings: No rash.  Neurological:     Mental Status: He is alert and oriented to person, place, and time.     Coordination: Coordination normal.  Psychiatric:        Behavior: Behavior normal.      Assessment & Plan:   Problem List Items Addressed This Visit       Other   Metatarsalgia of right foot   Other Visit Diagnoses     Well adult exam    -  Primary   Relevant Orders   CBC with Differential/Platelet   CMP14+EGFR   Lipid panel   PSA, total and free   Mixed hyperlipidemia       Relevant Medications   rosuvastatin (CRESTOR) 5 MG tablet   Other Relevant Orders   Lipid panel   Need for immunization against influenza       Relevant Orders   Flu Vaccine QUAD 4moIM (Fluarix, Fluzone & Alfiuria Quad PF) (Completed)       Continue current medicine.  Patient has been fighting some flatfeet and right ankle issues, wants to hold off on orthopedic referral for now because of busyness at work but will message back if he needs it in the future. Follow up plan: Return in about 1 year (around 04/04/2022), or if symptoms worsen or fail to improve, for Adult well exam.  Counseling provided for all of the vaccine components Orders Placed This Encounter  Procedures   Flu Vaccine QUAD 648moM (Fluarix, Fluzone & Alfiuria Quad PF)   CBC with Differential/Platelet   CMP14+EGFR   Lipid panel   PSA, total and free    JoCaryl PinaMD [facility] 04/04/2021, 3:17 PM

## 2021-04-05 LAB — CMP14+EGFR
ALT: 20 IU/L (ref 0–44)
AST: 22 IU/L (ref 0–40)
Albumin/Globulin Ratio: 2.1 (ref 1.2–2.2)
Albumin: 4.7 g/dL (ref 3.8–4.9)
Alkaline Phosphatase: 90 IU/L (ref 44–121)
BUN/Creatinine Ratio: 9 (ref 9–20)
BUN: 10 mg/dL (ref 6–24)
Bilirubin Total: 0.6 mg/dL (ref 0.0–1.2)
CO2: 25 mmol/L (ref 20–29)
Calcium: 9.7 mg/dL (ref 8.7–10.2)
Chloride: 100 mmol/L (ref 96–106)
Creatinine, Ser: 1.07 mg/dL (ref 0.76–1.27)
Globulin, Total: 2.2 g/dL (ref 1.5–4.5)
Glucose: 93 mg/dL (ref 65–99)
Potassium: 4.2 mmol/L (ref 3.5–5.2)
Sodium: 139 mmol/L (ref 134–144)
Total Protein: 6.9 g/dL (ref 6.0–8.5)
eGFR: 81 mL/min/{1.73_m2} (ref >59)

## 2021-04-05 LAB — CBC WITH DIFFERENTIAL/PLATELET
Basophils Absolute: 0.1 10*3/uL (ref 0.0–0.2)
Basos: 1 %
EOS (ABSOLUTE): 0.1 10*3/uL (ref 0.0–0.4)
Eos: 1 %
Hematocrit: 43.1 % (ref 37.5–51.0)
Hemoglobin: 15.1 g/dL (ref 13.0–17.7)
Immature Grans (Abs): 0 10*3/uL (ref 0.0–0.1)
Immature Granulocytes: 0 %
Lymphocytes Absolute: 2.3 10*3/uL (ref 0.7–3.1)
Lymphs: 25 %
MCH: 31.4 pg (ref 26.6–33.0)
MCHC: 35 g/dL (ref 31.5–35.7)
MCV: 90 fL (ref 79–97)
Monocytes Absolute: 0.9 10*3/uL (ref 0.1–0.9)
Monocytes: 9 %
Neutrophils Absolute: 5.8 10*3/uL (ref 1.4–7.0)
Neutrophils: 64 %
Platelets: 309 10*3/uL (ref 150–450)
RBC: 4.81 x10E6/uL (ref 4.14–5.80)
RDW: 12 % (ref 11.6–15.4)
WBC: 9.1 10*3/uL (ref 3.4–10.8)

## 2021-04-05 LAB — PSA, TOTAL AND FREE
PSA, Free Pct: 23 %
PSA, Free: 0.23 ng/mL
Prostate Specific Ag, Serum: 1 ng/mL (ref 0.0–4.0)

## 2021-04-05 LAB — LIPID PANEL
Chol/HDL Ratio: 2.4 ratio (ref 0.0–5.0)
Cholesterol, Total: 164 mg/dL (ref 100–199)
HDL: 68 mg/dL (ref >39)
LDL Chol Calc (NIH): 84 mg/dL (ref 0–99)
Triglycerides: 58 mg/dL (ref 0–149)
VLDL Cholesterol Cal: 12 mg/dL (ref 5–40)

## 2021-06-13 ENCOUNTER — Ambulatory Visit (INDEPENDENT_AMBULATORY_CARE_PROVIDER_SITE_OTHER): Payer: 59 | Admitting: Sports Medicine

## 2021-06-13 ENCOUNTER — Other Ambulatory Visit: Payer: Self-pay

## 2021-06-13 ENCOUNTER — Ambulatory Visit
Admission: RE | Admit: 2021-06-13 | Discharge: 2021-06-13 | Disposition: A | Discharge status: Home / Self Care | Payer: 59 | Source: Ambulatory Visit | Attending: Sports Medicine | Admitting: Sports Medicine

## 2021-06-13 VITALS — BP 160/100 | Ht 68.0 in | Wt 170.0 lb

## 2021-06-13 DIAGNOSIS — M19071 Primary osteoarthritis, right ankle and foot: Secondary | ICD-10-CM | POA: Diagnosis not present

## 2021-06-13 DIAGNOSIS — M79671 Pain in right foot: Secondary | ICD-10-CM

## 2021-06-13 NOTE — Patient Instructions (Signed)
Get your x-ray when you leave here at the [facility]. Call [facility] to schedule your MRI. See info below. We'll call you with results once they are available.   [facility] [address] [place],  [place]  [address] Phone: [phone]

## 2021-06-13 NOTE — Progress Notes (Signed)
   Subjective:    Patient ID: [name], male    DOB: 1965-11-27, 55 y.o.   MRN: [id number]  HPI chief complaint: Right foot pain  Lucas is a very pleasant 55 year old male that comes in today complaining of longstanding right foot pain.  He initially injured this foot approximately 10 years ago.  He was doing a lot of running at the time and suffered a navicular injury as a result of that.  He was treated conservatively by Dr. Oneida Alar and did well.  However, over time, he has noticed that his arch has significantly collapsed he has tried custom orthotics (Dr. Darene Lamer made him some about 5 years ago) which did appear to be helpful but he finds them uncomfortable to running.  He currently runs 10 to 15 miles a week.  Most of his pain is in the midfoot area.  He does find that if he wears a tight fitting ASO brace that it does help.  No prior foot surgeries.  Past medical history reviewed Medications reviewed Allergies reviewed    Review of Systems As above    Objective:   Physical Exam  Well-developed, well-nourished.  No acute distress  Examination of the right foot in standing position does show pes planus with slight calcaneal valgus.  He is able to normally invert the calcaneus with standing on his tiptoes.  He has collapse of the transverse arch.  No soft tissue swelling.  Good pulses.  MSK ultrasound of the right foot shows the posterior tibialis tendon to be intact.  X-rays of the right foot in the standing position shows significant midfoot DJD at the first, second, and third TMT joints.  Also degenerative changes at the first MTP joint.      Assessment & Plan:   Chronic right arch collapse-question incompetent posterior tibialis tendon Right foot DJD  I recommended proceeding with an MRI scan of the right foot specifically to thoroughly evaluate the soft tissue structures including the posterior tibialis tendon to see if we can find a cause for his transverse arch collapse.   He will follow-up with me after that study.  I have asked him to bring in his custom orthotics in his running shoes to that visit.  We may need to adjust those orthotics with more transverse arch and longitudinal arch support or we may just need to consider new orthotics.  In the meantime, I think it may be helpful for him to purchase a PTTD brace to see if that provides even more arch support then the ASO.  This note was dictated using Dragon naturally speaking software and may contain errors in syntax, spelling, or content which have not been identified prior to signing this note.

## 2021-06-15 ENCOUNTER — Other Ambulatory Visit: Payer: Self-pay

## 2021-06-15 ENCOUNTER — Ambulatory Visit (INDEPENDENT_AMBULATORY_CARE_PROVIDER_SITE_OTHER): Payer: 59

## 2021-06-15 DIAGNOSIS — G8929 Other chronic pain: Secondary | ICD-10-CM | POA: Diagnosis not present

## 2021-06-15 DIAGNOSIS — M19071 Primary osteoarthritis, right ankle and foot: Secondary | ICD-10-CM | POA: Diagnosis not present

## 2021-06-15 DIAGNOSIS — M79671 Pain in right foot: Secondary | ICD-10-CM | POA: Diagnosis not present

## 2021-06-27 ENCOUNTER — Ambulatory Visit: Payer: 59 | Admitting: Sports Medicine

## 2021-06-27 VITALS — BP 158/90 | Ht 68.0 in | Wt 170.0 lb

## 2021-06-27 DIAGNOSIS — M76829 Posterior tibial tendinitis, unspecified leg: Secondary | ICD-10-CM | POA: Diagnosis not present

## 2021-06-27 NOTE — Progress Notes (Signed)
Patient ID: [name], male   DOB: 20-Jul-1965, 55 y.o.   MRN: [id number]  Lucas returns today to discuss MRI findings of his right foot.  Soft tissue structures along the plantar aspect of the foot appear to be unremarkable.  He does have severe osteoarthritis of the second and third TMT joints as well as moderate osteoarthritis of the first TMT joint and navicular medial cuneiform joint.  However, he denies significant pain in this area.  He brought in his old orthotics which were constructed somewhere around 2017.  They have a hard base on them. Although the MRI of his foot is unremarkable, clinically he is dealing with posterior tibialis tendon insufficiency.  I recommend that we try new custom orthotics with a softer base for better cushioning as well as arch support.  He agrees.  Orthotics were constructed as below.  His gait was noted to be neutral with the orthotics in place.  No significant pronation on the right.  He found them to be extremely comfortable.  He may continue to use his med spec brace or a PTTD if he would like.  Since he denies any pain in the forefoot I deferred adding metatarsal support at this time.  He will follow-up as needed.  Patient was fitted for a : standard, cushioned, semi-rigid orthotic. The orthotic was heated and afterward the patient stood on the orthotic blank positioned on the orthotic stand. The patient was positioned in subtalar neutral position and 10 degrees of ankle dorsiflexion in a weight bearing stance. After completion of molding, a stable base was applied to the orthotic blank. The blank was ground to a stable position for weight bearing. Size: 10 Base: blue EVA Posting: none Additional orthotic padding: none

## 2022-04-09 ENCOUNTER — Encounter: Payer: Self-pay | Admitting: Family Medicine

## 2022-04-09 ENCOUNTER — Encounter: Payer: Self-pay | Admitting: Internal Medicine

## 2022-04-09 ENCOUNTER — Ambulatory Visit (INDEPENDENT_AMBULATORY_CARE_PROVIDER_SITE_OTHER): Payer: 59 | Admitting: Family Medicine

## 2022-04-09 VITALS — BP 130/77 | HR 85 | Temp 98.0°F | Ht 68.0 in | Wt 172.0 lb

## 2022-04-09 DIAGNOSIS — Z0001 Encounter for general adult medical examination with abnormal findings: Secondary | ICD-10-CM

## 2022-04-09 DIAGNOSIS — Z23 Encounter for immunization: Secondary | ICD-10-CM

## 2022-04-09 DIAGNOSIS — E782 Mixed hyperlipidemia: Secondary | ICD-10-CM | POA: Diagnosis not present

## 2022-04-09 DIAGNOSIS — Z0000 Encounter for general adult medical examination without abnormal findings: Secondary | ICD-10-CM | POA: Diagnosis not present

## 2022-04-09 DIAGNOSIS — Z125 Encounter for screening for malignant neoplasm of prostate: Secondary | ICD-10-CM | POA: Diagnosis not present

## 2022-04-09 MED ORDER — MELOXICAM 15 MG PO TABS: 15.0000 mg | ORAL_TABLET | Freq: Every day | ORAL | 3 refills | Status: DC

## 2022-04-09 MED ORDER — ROSUVASTATIN CALCIUM 5 MG PO TABS: ORAL_TABLET | ORAL | 3 refills | Status: DC

## 2022-04-09 NOTE — Progress Notes (Signed)
BP 130/77   Pulse 85   Temp 98 F (36.7 C)   Ht 5' 8"  (1.727 m)   Wt 172 lb (78 kg)   SpO2 99%   BMI 26.15 kg/m    Subjective:   Patient ID: [name], male    DOB: Mar 01, 1966, 56 y.o.   MRN: [id number]  HPI: [name] is a 56 y.o. male presenting on 04/09/2022 for Medical Management of Chronic Issues (CPE)   HPI Physical exam Patient denies any chest pain, shortness of breath, headaches or vision issues, abdominal complaints, diarrhea, nausea, vomiting, or joint issues.  Occasionally he has some insomnia from his mind racing and wakes up once a night to urinate.  He feels he is able to calm himself down well and does not want to necessarily need any medicine at this point.  Hyperlipidemia Patient is coming in for recheck of his hyperlipidemia. The patient is currently taking Crestor. They deny any issues with myalgias or history of liver damage from it. They deny any focal numbness or weakness or chest pain.   Relevant past medical, surgical, family and social history reviewed and updated as indicated. Interim medical history since our last visit reviewed. Allergies and medications reviewed and updated.  Review of Systems  Constitutional:  Negative for chills and fever.  HENT:  Negative for ear pain and tinnitus.   Eyes:  Negative for pain and discharge.  Respiratory:  Negative for cough, shortness of breath and wheezing.   Cardiovascular:  Negative for chest pain, palpitations and leg swelling.  Gastrointestinal:  Negative for abdominal pain, blood in stool, constipation and diarrhea.  Genitourinary:  Negative for dysuria and hematuria.  Musculoskeletal:  Positive for arthralgias (Occasional foot pain but doing a lot better). Negative for back pain, gait problem and myalgias.  Skin:  Negative for rash.  Neurological:  Negative for dizziness, weakness and headaches.  Psychiatric/Behavioral:  Negative for suicidal ideas.   All other systems reviewed and are  negative.   Per HPI unless specifically indicated above   Allergies as of 04/09/2022   No Known Allergies      Medication List        Accurate as of April 09, 2022  9:48 AM. If you have any questions, ask your nurse or doctor.          co-enzyme Q-10 30 MG capsule Take 30 mg by mouth 3 (three) times daily.   FISH OIL OMEGA-3 PO Take by mouth.   meloxicam 15 MG tablet Commonly known as: MOBIC Take 1 tablet (15 mg total) by mouth daily.   multivitamin tablet Take 1 tablet by mouth daily.   RED YEAST RICE PO Take by mouth.   rosuvastatin 5 MG tablet Commonly known as: CRESTOR TAKE (1) TABLET ON MONDAY, WEDNESDAY AND FRIDAY AT BEDTIME         Objective:   BP 130/77   Pulse 85   Temp 98 F (36.7 C)   Ht 5' 8"  (1.727 m)   Wt 172 lb (78 kg)   SpO2 99%   BMI 26.15 kg/m   Wt Readings from Last 3 Encounters:  04/09/22 172 lb (78 kg)  06/27/21 170 lb (77.1 kg)  06/13/21 170 lb (77.1 kg)    Physical Exam Vitals and nursing note reviewed.  Constitutional:      General: He is not in acute distress.    Appearance: He is well-developed. He is not diaphoretic.  HENT:     Right  Ear: Tympanic membrane and ear canal normal.     Left Ear: Tympanic membrane and ear canal normal.     Mouth/Throat:     Mouth: Mucous membranes are moist.     Pharynx: Oropharynx is clear. No posterior oropharyngeal erythema.  Eyes:     General: No scleral icterus.    Conjunctiva/sclera: Conjunctivae normal.  Neck:     Thyroid: No thyromegaly.  Cardiovascular:     Rate and Rhythm: Normal rate and regular rhythm.     Heart sounds: Normal heart sounds. No murmur heard. Pulmonary:     Effort: Pulmonary effort is normal. No respiratory distress.     Breath sounds: Normal breath sounds. No wheezing.  Abdominal:     General: Abdomen is flat. Bowel sounds are normal. There is no distension.     Palpations: Abdomen is soft.     Tenderness: There is no abdominal tenderness.      Hernia: No hernia is present.  Musculoskeletal:        General: No swelling. Normal range of motion.     Cervical back: Neck supple.  Lymphadenopathy:     Cervical: No cervical adenopathy.  Skin:    General: Skin is warm and dry.     Findings: No rash.  Neurological:     Mental Status: He is alert and oriented to person, place, and time.     Coordination: Coordination normal.  Psychiatric:        Mood and Affect: Mood normal.        Behavior: Behavior normal.       Assessment & Plan:   Problem List Items Addressed This Visit   None Visit Diagnoses     Physical exam    -  Primary   Relevant Orders   CBC with Differential/Platelet   CMP14+EGFR   Lipid panel   PSA, total and free   Mixed hyperlipidemia       Relevant Medications   rosuvastatin (CRESTOR) 5 MG tablet   Other Relevant Orders   Lipid panel   Need for immunization against influenza       Relevant Orders   Flu Vaccine QUAD 23moIM (Fluarix, Fluzone & Alfiuria Quad PF) (Completed)   Prostate cancer screening       Relevant Orders   PSA, total and free       Seems to be doing well, he is going to call for when he is due for colonoscopy, he thinks it was 5 years. Follow up plan: Return in about 1 year (around 04/10/2023), or if symptoms worsen or fail to improve, for Physical exam.  Counseling provided for all of the vaccine components Orders Placed This Encounter  Procedures   Flu Vaccine QUAD 633moM (Fluarix, Fluzone & Alfiuria Quad PF)   CBC with Differential/Platelet   CMP14+EGFR   Lipid panel   PSA, total and free    JoCaryl PinaMD [facility] 04/09/2022, 9:48 AM

## 2022-04-10 LAB — PSA, TOTAL AND FREE
PSA, Free Pct: 28.3 %
PSA, Free: 0.34 ng/mL
Prostate Specific Ag, Serum: 1.2 ng/mL (ref 0.0–4.0)

## 2022-04-10 LAB — CMP14+EGFR
ALT: 64 IU/L — ABNORMAL HIGH (ref 0–44)
AST: 60 IU/L — ABNORMAL HIGH (ref 0–40)
Albumin/Globulin Ratio: 1.8 (ref 1.2–2.2)
Albumin: 4.7 g/dL (ref 3.8–4.9)
Alkaline Phosphatase: 76 IU/L (ref 44–121)
BUN/Creatinine Ratio: 17 (ref 9–20)
BUN: 18 mg/dL (ref 6–24)
Bilirubin Total: 0.4 mg/dL (ref 0.0–1.2)
CO2: 23 mmol/L (ref 20–29)
Calcium: 9.7 mg/dL (ref 8.7–10.2)
Chloride: 101 mmol/L (ref 96–106)
Creatinine, Ser: 1.06 mg/dL (ref 0.76–1.27)
Globulin, Total: 2.6 g/dL (ref 1.5–4.5)
Glucose: 114 mg/dL — ABNORMAL HIGH (ref 70–99)
Potassium: 4.9 mmol/L (ref 3.5–5.2)
Sodium: 139 mmol/L (ref 134–144)
Total Protein: 7.3 g/dL (ref 6.0–8.5)
eGFR: 82 mL/min/{1.73_m2} (ref >59)

## 2022-04-10 LAB — LIPID PANEL
Chol/HDL Ratio: 3.2 ratio (ref 0.0–5.0)
Cholesterol, Total: 242 mg/dL — ABNORMAL HIGH (ref 100–199)
HDL: 76 mg/dL (ref >39)
LDL Chol Calc (NIH): 154 mg/dL — ABNORMAL HIGH (ref 0–99)
Triglycerides: 73 mg/dL (ref 0–149)
VLDL Cholesterol Cal: 12 mg/dL (ref 5–40)

## 2022-04-10 LAB — CBC WITH DIFFERENTIAL/PLATELET
Basophils Absolute: 0.1 10*3/uL (ref 0.0–0.2)
Basos: 1 %
EOS (ABSOLUTE): 0.1 10*3/uL (ref 0.0–0.4)
Eos: 2 %
Hematocrit: 46.4 % (ref 37.5–51.0)
Hemoglobin: 15.8 g/dL (ref 13.0–17.7)
Immature Grans (Abs): 0 10*3/uL (ref 0.0–0.1)
Immature Granulocytes: 0 %
Lymphocytes Absolute: 1.8 10*3/uL (ref 0.7–3.1)
Lymphs: 30 %
MCH: 31.8 pg (ref 26.6–33.0)
MCHC: 34.1 g/dL (ref 31.5–35.7)
MCV: 93 fL (ref 79–97)
Monocytes Absolute: 0.5 10*3/uL (ref 0.1–0.9)
Monocytes: 9 %
Neutrophils Absolute: 3.5 10*3/uL (ref 1.4–7.0)
Neutrophils: 58 %
Platelets: 275 10*3/uL (ref 150–450)
RBC: 4.97 x10E6/uL (ref 4.14–5.80)
RDW: 12.4 % (ref 11.6–15.4)
WBC: 6 10*3/uL (ref 3.4–10.8)

## 2022-04-17 ENCOUNTER — Other Ambulatory Visit: Payer: Self-pay

## 2022-04-17 DIAGNOSIS — R7989 Other specified abnormal findings of blood chemistry: Secondary | ICD-10-CM

## 2022-04-25 ENCOUNTER — Other Ambulatory Visit (HOSPITAL_BASED_OUTPATIENT_CLINIC_OR_DEPARTMENT_OTHER): Payer: Self-pay

## 2022-04-25 ENCOUNTER — Ambulatory Visit (AMBULATORY_SURGERY_CENTER): Payer: Self-pay

## 2022-04-25 ENCOUNTER — Other Ambulatory Visit: Payer: Self-pay

## 2022-04-25 VITALS — Ht 68.0 in | Wt 175.0 lb

## 2022-04-25 DIAGNOSIS — Z86010 Personal history of colonic polyps: Secondary | ICD-10-CM

## 2022-04-25 MED ORDER — NA SULFATE-K SULFATE-MG SULF 17.5-3.13-1.6 GM/177ML PO SOLN
1.0000 | Freq: Once | ORAL | 0 refills | Status: AC
  Filled 2022-04-25: qty 354, 1d supply, fill #0

## 2022-04-25 NOTE — Progress Notes (Signed)
Denies allergies to eggs or soy products. Denies complication of anesthesia or sedation. Denies use of weight loss medication. Denies use of O2.   Emmi instructions given for colonoscopy.  

## 2022-05-05 ENCOUNTER — Other Ambulatory Visit: Payer: 59

## 2022-05-05 DIAGNOSIS — R7989 Other specified abnormal findings of blood chemistry: Secondary | ICD-10-CM | POA: Diagnosis not present

## 2022-05-06 LAB — HEPATIC FUNCTION PANEL
ALT: 24 IU/L (ref 0–44)
AST: 26 IU/L (ref 0–40)
Albumin: 4.9 g/dL (ref 3.8–4.9)
Alkaline Phosphatase: 65 IU/L (ref 44–121)
Bilirubin Total: 0.5 mg/dL (ref 0.0–1.2)
Bilirubin, Direct: 0.15 mg/dL (ref 0.00–0.40)
Total Protein: 7 g/dL (ref 6.0–8.5)

## 2022-05-15 ENCOUNTER — Encounter: Payer: Self-pay | Admitting: Internal Medicine

## 2022-05-17 ENCOUNTER — Telehealth: Payer: 59 | Admitting: Nurse Practitioner

## 2022-05-17 DIAGNOSIS — S0532XA Ocular laceration without prolapse or loss of intraocular tissue, left eye, initial encounter: Secondary | ICD-10-CM | POA: Diagnosis not present

## 2022-05-17 MED ORDER — ERYTHROMYCIN 5 MG/GM OP OINT: 1.0000 | TOPICAL_OINTMENT | Freq: Every day | OPHTHALMIC | 0 refills | Status: DC

## 2022-05-17 NOTE — Patient Instructions (Signed)
  [name], thank you for joining Chevis Pretty, FNP for today's virtual visit.  While this provider is not your primary care provider (PCP), if your PCP is located in our provider database this encounter information will be shared with them immediately following your visit.   [facility] account gives you access to today's visit and all your visits, tests, and labs performed at [facility] " click here if you don't have a [facility] account or go to [email or web address]  Consent: (Patient) Lucas W Kedzierski provided verbal consent for this virtual visit at the beginning of the encounter.  Current Medications:  Current Outpatient Medications:    co-enzyme Q-10 30 MG capsule, Take 30 mg by mouth 3 (three) times daily., Disp: , Rfl:    erythromycin ophthalmic ointment, Place 1 Application into the left eye at bedtime., Disp: 3.5 g, Rfl: 0   meloxicam (MOBIC) 15 MG tablet, Take 1 tablet (15 mg total) by mouth daily. (Patient not taking: Reported on 04/25/2022), Disp: 90 tablet, Rfl: 3   Multiple Vitamin (MULTIVITAMIN) tablet, Take 1 tablet by mouth daily., Disp: , Rfl:    Omega-3 Fatty Acids (FISH OIL OMEGA-3 PO), Take by mouth., Disp: , Rfl:    Red Yeast Rice Extract (RED YEAST RICE PO), Take by mouth., Disp: , Rfl:    rosuvastatin (CRESTOR) 5 MG tablet, TAKE (1) TABLET ON MONDAY, WEDNESDAY AND FRIDAY AT BEDTIME, Disp: 36 tablet, Rfl: 3   Medications ordered in this encounter:  Meds ordered this encounter  Medications   erythromycin ophthalmic ointment    Sig: Place 1 Application into the left eye at bedtime.    Dispense:  3.5 g    Refill:  0    Order Specific Question:   Supervising Provider    Answer:   Chase Picket [[id number]]     *If you need refills on other medications prior to your next appointment, please contact your pharmacy*  Follow-Up: Call back or seek an in-person evaluation if the symptoms worsen or if the condition  fails to improve as anticipated.  [place] [place] [phone]  Other Instructions Avoid rubbing 'ice Keep closed Let someone see it Monday   If you have been instructed to have an in-person evaluation today at a local Urgent Care facility, please use the link below. It will take you to a list of all of our available [facility], including address, phone number and hours of operation. Please do not delay care.  [facility]  If you or a family member do not have a primary care provider, use the link below to schedule a visit and establish care. When you choose a [facility] primary care physician or advanced practice provider, you gain a long-term partner in health. Find a Primary Care Provider  Learn more about [facility] in-office and virtual care options: [facility]

## 2022-05-17 NOTE — Progress Notes (Signed)
Virtual Visit Consent   [name], you are scheduled for a virtual visit with Mary-Margaret Hassell Done, [place], a [facility] provider, today.     Just as with appointments in the office, your consent must be obtained to participate.  Your consent will be active for this visit and any virtual visit you may have with one of our providers in the next 365 days.     If you have a MyChart account, a copy of this consent can be sent to you electronically.  All virtual visits are billed to your insurance company just like a traditional visit in the office.    As this is a virtual visit, video technology does not allow for your provider to perform a traditional examination.  This may limit your provider's ability to fully assess your condition.  If your provider identifies any concerns that need to be evaluated in person or the need to arrange testing (such as labs, EKG, etc.), we will make arrangements to do so.     Although advances in technology are sophisticated, we cannot ensure that it will always work on either your end or our end.  If the connection with a video visit is poor, the visit may have to be switched to a telephone visit.  With either a video or telephone visit, we are not always able to ensure that we have a secure connection.     I need to obtain your verbal consent now.   Are you willing to proceed with your visit today? YES   [name] has provided verbal consent on 05/17/2022 for a virtual visit (video or telephone).   Mary-Margaret Hassell Done, FNP   Date: 05/17/2022 7:23 PM   Virtual Visit via Video Note   I, Mary-Margaret Hassell Done, connected with [name] ([id number], 04-Mar-1966) on 05/17/22 at  7:30 PM EDT by a video-enabled telemedicine application and verified that I am speaking with the correct person using two identifiers.  Location: Patient: Virtual Visit Location Patient: Home Provider: Virtual Visit Location Provider: Mobile   I discussed the  limitations of evaluation and management by telemedicine and the availability of in person appointments. The patient expressed understanding and agreed to proceed.    History of Present Illness: [name] is a 56 y.o. who identifies as a male who was assigned male at birth, and is being seen today for red eye.  HPI: Patient walked into a tree limb and it poke him in the left eye. Now it is red , watery and painful.deies any visual disturbances. Rates pain 8/10 when eye is open    Review of Systems  Eyes:  Positive for pain, discharge and redness. Negative for blurred vision, double vision and photophobia.    Problems:  Patient Active Problem List   Diagnosis Date Noted   Pes planus 07/04/2016   Palpitations 01/05/2013   Metatarsalgia of right foot 12/03/2011    Allergies: No Known Allergies Medications:  Current Outpatient Medications:    co-enzyme Q-10 30 MG capsule, Take 30 mg by mouth 3 (three) times daily., Disp: , Rfl:    erythromycin ophthalmic ointment, Place 1 Application into the left eye at bedtime., Disp: 3.5 g, Rfl: 0   meloxicam (MOBIC) 15 MG tablet, Take 1 tablet (15 mg total) by mouth daily. (Patient not taking: Reported on 04/25/2022), Disp: 90 tablet, Rfl: 3   Multiple Vitamin (MULTIVITAMIN) tablet, Take 1 tablet by mouth daily., Disp: , Rfl:    Omega-3 Fatty Acids (  FISH OIL OMEGA-3 PO), Take by mouth., Disp: , Rfl:    Red Yeast Rice Extract (RED YEAST RICE PO), Take by mouth., Disp: , Rfl:    rosuvastatin (CRESTOR) 5 MG tablet, TAKE (1) TABLET ON MONDAY, WEDNESDAY AND FRIDAY AT BEDTIME, Disp: 36 tablet, Rfl: 3  Observations/Objective: Patient is well-developed, well-nourished in no acute distress.  Resting comfortably  at home.  Head is normocephalic, atraumatic.  No labored breathing.  Speech is clear and coherent with logical content.  Patient is alert and oriented at baseline.  Eft scleral injection  Assessment and Plan:  [name] in today  with chief complaint of Conjunctivitis   1. Scleral laceration of left eye, initial encounter Apply ointment prescribed- close eye and put a patch over eye. Avoid rubbing Ice   Meds ordered this encounter  Medications   erythromycin ophthalmic ointment    Sig: Place 1 Application into the left eye at bedtime.    Dispense:  3.5 g    Refill:  0    Order Specific Question:   Supervising Provider    Answer:   Chase Picket [[id number]]      Follow Up Instructions: I discussed the assessment and treatment plan with the patient. The patient was provided an opportunity to ask questions and all were answered. The patient agreed with the plan and demonstrated an understanding of the instructions.  A copy of instructions were sent to the patient via MyChart.  The patient was advised to call back or seek an in-person evaluation if the symptoms worsen or if the condition fails to improve as anticipated.  Time:  I spent 10 minutes with the patient via telehealth technology discussing the above problems/concerns.    Mary-Margaret Hassell Done, FNP

## 2022-05-19 ENCOUNTER — Other Ambulatory Visit (HOSPITAL_BASED_OUTPATIENT_CLINIC_OR_DEPARTMENT_OTHER): Payer: Self-pay

## 2022-05-19 MED ORDER — TOBRAMYCIN 0.3 % OP SOLN
OPHTHALMIC | 0 refills | Status: DC
  Filled 2022-05-19: qty 5, 4d supply, fill #0

## 2022-05-23 ENCOUNTER — Ambulatory Visit (AMBULATORY_SURGERY_CENTER): Payer: 59 | Admitting: Internal Medicine

## 2022-05-23 ENCOUNTER — Encounter: Payer: Self-pay | Admitting: Internal Medicine

## 2022-05-23 VITALS — BP 119/79 | HR 58 | Temp 98.7°F | Resp 15 | Ht 68.0 in | Wt 175.0 lb

## 2022-05-23 DIAGNOSIS — Z86010 Personal history of colonic polyps: Secondary | ICD-10-CM | POA: Diagnosis not present

## 2022-05-23 DIAGNOSIS — Z09 Encounter for follow-up examination after completed treatment for conditions other than malignant neoplasm: Secondary | ICD-10-CM

## 2022-05-23 DIAGNOSIS — Z1211 Encounter for screening for malignant neoplasm of colon: Secondary | ICD-10-CM | POA: Diagnosis not present

## 2022-05-23 MED ORDER — SODIUM CHLORIDE 0.9 % IV SOLN: 500.0000 mL | Freq: Once | INTRAVENOUS | Status: DC

## 2022-05-23 NOTE — Patient Instructions (Signed)
Thank you for letting us take care of your healthcare needs today.  Please see handouts given to you on Diverticulosis and Hemorrhoids.     YOU HAD AN ENDOSCOPIC PROCEDURE TODAY AT [facility]:   Refer to the procedure report that was given to you for any specific questions about what was found during the examination.  If the procedure report does not answer your questions, please call your gastroenterologist to clarify.  If you requested that your care partner not be given the details of your procedure findings, then the procedure report has been included in a sealed envelope for you to review at your convenience later.  YOU SHOULD EXPECT: Some feelings of bloating in the abdomen. Passage of more gas than usual.  Walking can help get rid of the air that was put into your GI tract during the procedure and reduce the bloating. If you had a lower endoscopy (such as a colonoscopy or flexible sigmoidoscopy) you may notice spotting of blood in your stool or on the toilet paper. If you underwent a bowel prep for your procedure, you may not have a normal bowel movement for a few days.  Please Note:  You might notice some irritation and congestion in your nose or some drainage.  This is from the oxygen used during your procedure.  There is no need for concern and it should clear up in a day or so.  SYMPTOMS TO REPORT IMMEDIATELY:  Following lower endoscopy (colonoscopy or flexible sigmoidoscopy):  Excessive amounts of blood in the stool  Significant tenderness or worsening of abdominal pains  Swelling of the abdomen that is new, acute  Fever of 100F or higher   For urgent or emergent issues, a gastroenterologist can be reached at any hour by calling [phone]. Do not use MyChart messaging for urgent concerns.    DIET:  We do recommend a small meal at first, but then you may proceed to your regular diet.  Drink plenty of fluids but you should avoid alcoholic beverages for 24  hours.  ACTIVITY:  You should plan to take it easy for the rest of today and you should NOT DRIVE or use heavy machinery until tomorrow (because of the sedation medicines used during the test).    FOLLOW UP: Our staff will call the number listed on your records the next business day following your procedure.  We will call around 7:15- 8:00 am to check on you and address any questions or concerns that you may have regarding the information given to you following your procedure. If we do not reach you, we will leave a message.     If any biopsies were taken you will be contacted by phone or by letter within the next 1-3 weeks.  Please call us at [phone] if you have not heard about the biopsies in 3 weeks.    SIGNATURES/CONFIDENTIALITY: You and/or your care partner have signed paperwork which will be entered into your electronic medical record.  These signatures attest to the fact that that the information above on your After Visit Summary has been reviewed and is understood.  Full responsibility of the confidentiality of this discharge information lies with you and/or your care-partner.

## 2022-05-23 NOTE — Progress Notes (Signed)
To pacu, VSS. Report to [email or web address] 

## 2022-05-23 NOTE — Op Note (Signed)
[facility] Patient Name: [name] Procedure Date: 05/23/2022 2:52 PM MRN: [id number] Endoscopist: Jerene Bears , MD, [id number] Age: 56 Referring MD:  Date of Birth: May 25, 1966 Gender: Male Account #: [id number] Procedure:                Colonoscopy Indications:              High risk colon cancer surveillance: Personal                            history of non-advanced adenomas, Last colonoscopy:                            October 2018 (2 subcentimeter adenomas removed) Medicines:                Monitored Anesthesia Care Procedure:                Pre-Anesthesia Assessment:                           - Prior to the procedure, a History and Physical                            was performed, and patient medications and                            allergies were reviewed. The patient's tolerance of                            previous anesthesia was also reviewed. The risks                            and benefits of the procedure and the sedation                            options and risks were discussed with the patient.                            All questions were answered, and informed consent                            was obtained. Prior Anticoagulants: The patient has                            taken no anticoagulant or antiplatelet agents. ASA                            Grade Assessment: II - A patient with mild systemic                            disease. After reviewing the risks and benefits,                            the patient was deemed in satisfactory condition to  undergo the procedure.                           After obtaining informed consent, the colonoscope                            was passed under direct vision. Throughout the                            procedure, the patient's blood pressure, pulse, and                            oxygen saturations were monitored continuously. The                            CF [id number]  #[id number] was introduced through the anus                            and advanced to the cecum, identified by                            appendiceal orifice and ileocecal valve. The                            colonoscopy was performed without difficulty. The                            patient tolerated the procedure well. The quality                            of the bowel preparation was good. The ileocecal                            valve, appendiceal orifice, and rectum were                            photographed. Scope In: 3:00:47 PM Scope Out: 3:12:32 PM Scope Withdrawal Time: 0 hours 10 minutes 6 seconds  Total Procedure Duration: 0 hours 11 minutes 45 seconds  Findings:                 The digital rectal exam was normal.                           Multiple small-mouthed diverticula were found in                            the sigmoid colon, descending colon, ascending                            colon and cecum.                           Internal hemorrhoids were found during  retroflexion. The hemorrhoids were small.                           The exam was otherwise without abnormality. Complications:            No immediate complications. Estimated Blood Loss:     Estimated blood loss: none. Impression:               - Mild diverticulosis in the sigmoid colon, in the                            descending colon, in the ascending colon and in the                            cecum.                           - Small internal hemorrhoids.                           - The examination was otherwise normal.                           - No specimens collected. Recommendation:           - Patient has a contact number available for                            emergencies. The signs and symptoms of potential                            delayed complications were discussed with the                            patient. Return to normal activities tomorrow.                             Written discharge instructions were provided to the                            patient.                           - Resume previous diet.                           - Continue present medications.                           - Repeat colonoscopy in 10 years for surveillance. Jerene Bears, MD 05/23/2022 3:16:01 PM This report has been signed electronically.

## 2022-05-23 NOTE — Progress Notes (Signed)
Patient ID: [name], male   DOB: 1966/01/26, 56 y.o.   MRN: [id number]    GASTROENTEROLOGY PROCEDURE H&P NOTE   Primary Care Physician: Dettinger, Fransisca Kaufmann, MD    Reason for Procedure:  History of adenomatous colon polyps  Plan:    Colonoscopy  Patient is appropriate for endoscopic procedure(s) in the ambulatory ([facility]) setting.  The nature of the procedure, as well as the risks, benefits, and alternatives were carefully and thoroughly reviewed with the patient. Ample time for discussion and questions allowed. The patient understood, was satisfied, and agreed to proceed.     HPI: [name] is a 56 y.o. male who presents for surveillance colonoscopy.  Medical history as below.  Tolerated the prep.  No recent chest pain or shortness of breath.  No abdominal pain today.  Past Medical History:  Diagnosis Date   Hyperlipidemia    not on meds/diet controlled    Past Surgical History:  Procedure Laterality Date   COLONOSCOPY     HERNIA REPAIR     rt. orchidectomy     age 57    Prior to Admission medications   Medication Sig Start Date End Date Taking? Authorizing Provider  co-enzyme Q-10 30 MG capsule Take 30 mg by mouth 3 (three) times daily.   Yes [provider]  erythromycin ophthalmic ointment Place 1 Application into the left eye at bedtime. 05/17/22  Yes Martin, Mary-Margaret, FNP  Multiple Vitamin (MULTIVITAMIN) tablet Take 1 tablet by mouth daily.   Yes [provider]  Omega-3 Fatty Acids (FISH OIL OMEGA-3 PO) Take by mouth.   Yes [provider]  Red Yeast Rice Extract (RED YEAST RICE PO) Take by mouth.   Yes [provider]  tobramycin (TOBREX) 0.3 % ophthalmic solution Instill 1 drop into left eye 4 times daily for 4 days 05/19/22  Yes   meloxicam (MOBIC) 15 MG tablet Take 1 tablet (15 mg total) by mouth daily. Patient not taking: Reported on 04/25/2022 04/09/22   Dettinger, Fransisca Kaufmann, MD  rosuvastatin (CRESTOR) 5 MG  tablet TAKE (1) TABLET ON MONDAY, [facility] AND FRIDAY AT BEDTIME 04/09/22   Dettinger, Fransisca Kaufmann, MD    Current Outpatient Medications  Medication Sig Dispense Refill   co-enzyme Q-10 30 MG capsule Take 30 mg by mouth 3 (three) times daily.     erythromycin ophthalmic ointment Place 1 Application into the left eye at bedtime. 3.5 g 0   Multiple Vitamin (MULTIVITAMIN) tablet Take 1 tablet by mouth daily.     Omega-3 Fatty Acids (FISH OIL OMEGA-3 PO) Take by mouth.     Red Yeast Rice Extract (RED YEAST RICE PO) Take by mouth.     tobramycin (TOBREX) 0.3 % ophthalmic solution Instill 1 drop into left eye 4 times daily for 4 days 5 mL 0   meloxicam (MOBIC) 15 MG tablet Take 1 tablet (15 mg total) by mouth daily. (Patient not taking: Reported on 04/25/2022) 90 tablet 3   rosuvastatin (CRESTOR) 5 MG tablet TAKE (1) TABLET ON MONDAY, WEDNESDAY AND FRIDAY AT BEDTIME 36 tablet 3   Current Facility-Administered Medications  Medication Dose Route Frequency Provider Last Rate Last Admin   0.9 %  sodium chloride infusion  500 mL Intravenous Once Sahily Biddle, Lajuan Lines, MD        Allergies as of 05/23/2022   (No Known Allergies)    Family History  Problem Relation Age of Onset   Hypertension Mother    Hypertension Father  Clotting disorder Neg Hx    Colitis Neg Hx    Esophageal cancer Neg Hx    Rectal cancer Neg Hx    Stomach cancer Neg Hx    Colon cancer Neg Hx     Social History   Socioeconomic History   Marital status: Married    Spouse name: Not on file   Number of children: Not on file   Years of education: Not on file   Highest education level: Not on file  Occupational History   Not on file  Tobacco Use   Smoking status: Never   Smokeless tobacco: Never  Vaping Use   Vaping Use: Never used  Substance and Sexual Activity   Alcohol use: No   Drug use: No   Sexual activity: Not on file  Other Topics Concern   Not on file  Social History Narrative   Not on file   Social  Determinants of Health   Financial Resource Strain: Not on file  Food Insecurity: Not on file  Transportation Needs: Not on file  Physical Activity: Not on file  Stress: Not on file  Social Connections: Not on file  Intimate Partner Violence: Not on file    Physical Exam: Vital signs in last 24 hours: '@BP'$  (!) 159/126   Pulse 83   Temp 98.7 F (37.1 C)   Ht '5\' 8"'$  (1.727 m)   Wt 175 lb (79.4 kg)   SpO2 97%   BMI 26.61 kg/m  GEN: NAD EYE: Sclerae anicteric ENT: MMM CV: Non-tachycardic Pulm: CTA b/l GI: Soft, NT/ND NEURO:  Alert & Oriented x 3   Zenovia Jarred, MD [facility]  05/23/2022 2:45 PM

## 2022-05-26 ENCOUNTER — Telehealth: Payer: Self-pay | Admitting: *Deleted

## 2022-05-26 NOTE — Telephone Encounter (Signed)
  Follow up Call-     05/23/2022    1:38 PM  Call back number  Post procedure Call Back phone  # [phone]  Permission to leave phone message Yes     Patient questions:  Do you have a fever, pain , or abdominal swelling? No. Pain Score  0 *  Have you tolerated food without any problems? Yes.    Have you been able to return to your normal activities? Yes.    Do you have any questions about your discharge instructions: Diet   No. Medications  No. Follow up visit  No.  Do you have questions or concerns about your Care? No.  Actions: * If pain score is 4 or above: No action needed, pain <4.

## 2022-07-09 ENCOUNTER — Encounter: Payer: Self-pay | Admitting: Nurse Practitioner

## 2022-07-09 ENCOUNTER — Ambulatory Visit (INDEPENDENT_AMBULATORY_CARE_PROVIDER_SITE_OTHER): Payer: 59 | Admitting: Nurse Practitioner

## 2022-07-09 VITALS — BP 147/87 | HR 87 | Temp 98.6°F | Resp 20 | Ht 68.0 in | Wt 171.0 lb

## 2022-07-09 DIAGNOSIS — R0981 Nasal congestion: Secondary | ICD-10-CM | POA: Diagnosis not present

## 2022-07-09 LAB — VERITOR FLU A/B WAIVED
Influenza A: NEGATIVE
Influenza B: NEGATIVE

## 2022-07-09 NOTE — Patient Instructions (Signed)

## 2022-07-09 NOTE — Progress Notes (Signed)
   Subjective:    Patient ID: [name], male    DOB: [birth year], 56 y.o.   MRN: [id number]   Chief Complaint: Doesn't feel right   HPI Patient says last night he started feeling slight chest congestion and achiness. He took some nyquil and feels a little better this morning wants to be checked for flu and covid.     Review of Systems  Constitutional:  Positive for chills. Fever: ?Marland Kitchen HENT:  Positive for congestion and rhinorrhea. Negative for sinus pressure and sinus pain.   Respiratory:  Positive for cough (slight).        Objective:   Physical Exam Vitals and nursing note reviewed.  Constitutional:      Appearance: Normal appearance.  Cardiovascular:     Rate and Rhythm: Normal rate and regular rhythm.     Heart sounds: Normal heart sounds.  Pulmonary:     Effort: Pulmonary effort is normal.     Breath sounds: Normal breath sounds.  Skin:    General: Skin is warm.  Neurological:     General: No focal deficit present.     Mental Status: He is alert and oriented to person, place, and time.  Psychiatric:        Mood and Affect: Mood normal.        Behavior: Behavior normal.     BP (!) 147/87   Pulse 87   Temp 98.6 F (37 C) (Temporal)   Resp 20   Ht '5\' 8"'$  (1.727 m)   Wt 171 lb (77.6 kg)   SpO2 95%   BMI 26.00 kg/m   Flu negative       Assessment & Plan:   Lucas W [place] in today with chief complaint of Doesn't feel right   1. Nasal congestion 1. Take meds as prescribed 2. Use a cool mist humidifier especially during the winter months and when heat has been humid. 3. Use saline nose sprays frequently 4. Saline irrigations of the nose can be very helpful if done frequently.  * 4X daily for 1 week*  * Use of a nettie pot can be helpful with this. Follow directions with this* 5. Drink plenty of fluids 6. Keep thermostat turn down low 7.For any cough or congestion- mucinex OTC 8. For fever or aces or pains- take tylenol or ibuprofen  appropriate for age and weight.  * for fevers greater than 101 orally you may alternate ibuprofen and tylenol every  3 hours.    - Novel Coronavirus, NAA ([facility]) - Veritor Flu A/B Waived    The above assessment and management plan was discussed with the patient. The patient verbalized understanding of and has agreed to the management plan. Patient is aware to call the clinic if symptoms persist or worsen. Patient is aware when to return to the clinic for a follow-up visit. Patient educated on when it is appropriate to go to the emergency department.   Mary-Margaret Hassell Done, FNP

## 2022-07-11 ENCOUNTER — Other Ambulatory Visit (HOSPITAL_BASED_OUTPATIENT_CLINIC_OR_DEPARTMENT_OTHER): Payer: Self-pay

## 2022-07-11 ENCOUNTER — Other Ambulatory Visit: Payer: Self-pay | Admitting: Nurse Practitioner

## 2022-07-11 LAB — NOVEL CORONAVIRUS, NAA: SARS-CoV-2, NAA: DETECTED — AB

## 2022-07-11 MED ORDER — NIRMATRELVIR/RITONAVIR (PAXLOVID)TABLET
3.0000 | ORAL_TABLET | Freq: Two times a day (BID) | ORAL | 0 refills | Status: AC
  Filled 2022-07-11: qty 30, 5d supply, fill #0

## 2022-07-11 NOTE — Progress Notes (Signed)
Positive covid- paxlovid  Meds ordered this encounter  Medications   nirmatrelvir/ritonavir (PAXLOVID) 20 x 150 MG & 10 x '100MG'$  TABS    Sig: Take 3 tablets by mouth 2 (two) times daily for 5 days. Patient GFR is 82. Take nirmatrelvir (150 mg) two tablets twice daily for 5 days and ritonavir (100 mg) one tablet twice daily for 5 days.    Dispense:  30 tablet    Refill:  0    Order Specific Question:   Supervising Provider    Answer:   Chase Picket [[id number]]   [place], FNP'

## 2023-04-13 ENCOUNTER — Ambulatory Visit (INDEPENDENT_AMBULATORY_CARE_PROVIDER_SITE_OTHER): Payer: Commercial Managed Care - PPO | Admitting: Family Medicine

## 2023-04-13 ENCOUNTER — Encounter: Payer: Self-pay | Admitting: Family Medicine

## 2023-04-13 VITALS — BP 147/84 | HR 73 | Temp 98.4°F | Resp 20 | Ht 68.0 in | Wt 174.2 lb

## 2023-04-13 DIAGNOSIS — R351 Nocturia: Secondary | ICD-10-CM

## 2023-04-13 DIAGNOSIS — Z0001 Encounter for general adult medical examination with abnormal findings: Secondary | ICD-10-CM | POA: Diagnosis not present

## 2023-04-13 DIAGNOSIS — Z0000 Encounter for general adult medical examination without abnormal findings: Secondary | ICD-10-CM | POA: Diagnosis not present

## 2023-04-13 DIAGNOSIS — E782 Mixed hyperlipidemia: Secondary | ICD-10-CM | POA: Insufficient documentation

## 2023-04-13 LAB — LIPID PANEL

## 2023-04-13 MED ORDER — ROSUVASTATIN CALCIUM 5 MG PO TABS
ORAL_TABLET | ORAL | 3 refills | Status: DC
Start: 2023-04-13 — End: 2024-04-20

## 2023-04-13 MED ORDER — MELOXICAM 15 MG PO TABS: 15.0000 mg | ORAL_TABLET | Freq: Every day | ORAL | 3 refills | Status: DC

## 2023-04-13 NOTE — Progress Notes (Signed)
BP (!) 147/84   Pulse 73   Temp 98.4 F (36.9 C) (Oral)   Resp 20   Ht 5\' 8"  (1.727 m)   Wt 174 lb 4 oz (79 kg)   SpO2 97%   BMI 26.49 kg/m    Subjective:   Patient ID: [name], male    DOB: 1966/07/01, 57 y.o.   MRN: [id number]  HPI: [name] is a 57 y.o. male presenting on 04/13/2023 for Medical Management of Chronic Issues   HPI Physical exam Patient denies any chest pain, shortness of breath, headaches or vision issues, abdominal complaints, diarrhea, nausea, vomiting, or joint issues.   Hyperlipidemia Patient is coming in for recheck of his hyperlipidemia. The patient is currently taking Crestor. They deny any issues with myalgias or history of liver damage from it. They deny any focal numbness or weakness or chest pain.   Relevant past medical, surgical, family and social history reviewed and updated as indicated. Interim medical history since our last visit reviewed. Allergies and medications reviewed and updated.  Review of Systems  Constitutional:  Negative for chills and fever.  HENT:  Negative for ear pain and tinnitus.   Eyes:  Negative for pain and visual disturbance.  Respiratory:  Negative for cough, shortness of breath and wheezing.   Cardiovascular:  Negative for chest pain, palpitations and leg swelling.  Gastrointestinal:  Negative for abdominal pain, blood in stool, constipation and diarrhea.  Genitourinary:  Negative for dysuria and hematuria.  Musculoskeletal:  Negative for back pain, gait problem and myalgias.  Skin:  Negative for rash.  Neurological:  Negative for dizziness, weakness and headaches.  Psychiatric/Behavioral:  Negative for suicidal ideas.   All other systems reviewed and are negative.   Per HPI unless specifically indicated above   Allergies as of 04/13/2023   No Known Allergies      Medication List        Accurate as of April 13, 2023  9:30 AM. If you have any questions, ask your nurse or doctor.           STOP taking these medications    erythromycin ophthalmic ointment Stopped by: Elige Radon Darlen Gledhill   tobramycin 0.3 % ophthalmic solution Commonly known as: TOBREX Stopped by: Elige Radon Sonya Pucci       TAKE these medications    co-enzyme Q-10 30 MG capsule Take 30 mg by mouth 3 (three) times daily.   FISH OIL OMEGA-3 PO Take by mouth.   meloxicam 15 MG tablet Commonly known as: MOBIC Take 1 tablet (15 mg total) by mouth daily.   multivitamin tablet Take 1 tablet by mouth daily.   RED YEAST RICE PO Take by mouth.   rosuvastatin 5 MG tablet Commonly known as: CRESTOR TAKE (1) TABLET ON MONDAY, WEDNESDAY AND FRIDAY AT BEDTIME         Objective:   BP (!) 147/84   Pulse 73   Temp 98.4 F (36.9 C) (Oral)   Resp 20   Ht 5\' 8"  (1.727 m)   Wt 174 lb 4 oz (79 kg)   SpO2 97%   BMI 26.49 kg/m   Wt Readings from Last 3 Encounters:  04/13/23 174 lb 4 oz (79 kg)  07/09/22 171 lb (77.6 kg)  05/23/22 175 lb (79.4 kg)    Physical Exam Vitals reviewed.  Constitutional:      General: He is not in acute distress.    Appearance: He is well-developed. He is not diaphoretic.  HENT:     Right Ear: External ear normal.     Left Ear: External ear normal.     Nose: Nose normal.     Mouth/Throat:     Pharynx: No oropharyngeal exudate.  Eyes:     General: No scleral icterus.    Conjunctiva/sclera: Conjunctivae normal.     Pupils: Pupils are equal, round, and reactive to light.  Neck:     Thyroid: No thyromegaly.  Cardiovascular:     Rate and Rhythm: Normal rate and regular rhythm.     Heart sounds: Normal heart sounds. No murmur heard. Pulmonary:     Effort: Pulmonary effort is normal. No respiratory distress.     Breath sounds: Normal breath sounds. No wheezing.  Abdominal:     General: Bowel sounds are normal. There is no distension.     Palpations: Abdomen is soft.     Tenderness: There is no abdominal tenderness. There is no guarding or rebound.   Musculoskeletal:        General: Normal range of motion.     Cervical back: Neck supple.  Lymphadenopathy:     Cervical: No cervical adenopathy.  Skin:    General: Skin is warm and dry.     Findings: No rash.  Neurological:     Mental Status: He is alert and oriented to person, place, and time.     Coordination: Coordination normal.  Psychiatric:        Behavior: Behavior normal.       Assessment & Plan:   Problem List Items Addressed This Visit       Other   Mixed hyperlipidemia   Relevant Medications   rosuvastatin (CRESTOR) 5 MG tablet   Other Relevant Orders   Lipid panel   Other Visit Diagnoses     Physical exam    -  Primary   Relevant Orders   CBC with Differential/Platelet   CMP14+EGFR   Lipid panel   PSA, total and free   Nocturia       Relevant Orders   PSA, total and free       Blood pressure slightly elevated, he will keep a close eye on it and says it has been running better for him but he will check it more closely in the near future.  He does have some anxiousness and stated late last night thinks that may have affected things.  He will also watch his salt intake.  He does exercise and stay active. Follow up plan: Return in about 1 year (around 04/12/2024), or if symptoms worsen or fail to improve, for Physical exam and cholesterol recheck.  Counseling provided for all of the vaccine components Orders Placed This Encounter  Procedures   CBC with Differential/Platelet   CMP14+EGFR   Lipid panel   PSA, total and free    Arville Care, MD [place] [facility] 04/13/2023, 9:30 AM

## 2023-04-14 LAB — CBC WITH DIFFERENTIAL/PLATELET
Basophils Absolute: 0.1 10*3/uL (ref 0.0–0.2)
Basos: 1 %
EOS (ABSOLUTE): 0.1 10*3/uL (ref 0.0–0.4)
Eos: 1 %
Hematocrit: 46.7 % (ref 37.5–51.0)
Hemoglobin: 15.7 g/dL (ref 13.0–17.7)
Immature Grans (Abs): 0 10*3/uL (ref 0.0–0.1)
Immature Granulocytes: 0 %
Lymphocytes Absolute: 1.5 10*3/uL (ref 0.7–3.1)
Lymphs: 26 %
MCH: 31.7 pg (ref 26.6–33.0)
MCHC: 33.6 g/dL (ref 31.5–35.7)
MCV: 94 fL (ref 79–97)
Monocytes Absolute: 0.5 10*3/uL (ref 0.1–0.9)
Monocytes: 9 %
Neutrophils Absolute: 3.8 10*3/uL (ref 1.4–7.0)
Neutrophils: 63 %
Platelets: 266 10*3/uL (ref 150–450)
RBC: 4.96 x10E6/uL (ref 4.14–5.80)
RDW: 12.2 % (ref 11.6–15.4)
WBC: 6 10*3/uL (ref 3.4–10.8)

## 2023-04-14 LAB — CMP14+EGFR
ALT: 24 IU/L (ref 0–44)
AST: 22 [IU]/L (ref 0–40)
Albumin: 4.7 g/dL (ref 3.8–4.9)
Alkaline Phosphatase: 78 [IU]/L (ref 44–121)
BUN/Creatinine Ratio: 12 (ref 9–20)
BUN: 13 mg/dL (ref 6–24)
Bilirubin Total: 0.5 mg/dL (ref 0.0–1.2)
CO2: 23 mmol/L (ref 20–29)
Calcium: 9.5 mg/dL (ref 8.7–10.2)
Chloride: 102 mmol/L (ref 96–106)
Creatinine, Ser: 1.13 mg/dL (ref 0.76–1.27)
Globulin, Total: 2.2 g/dL (ref 1.5–4.5)
Glucose: 113 mg/dL — ABNORMAL HIGH (ref 70–99)
Potassium: 4.5 mmol/L (ref 3.5–5.2)
Sodium: 140 mmol/L (ref 134–144)
Total Protein: 6.9 g/dL (ref 6.0–8.5)
eGFR: 75 mL/min/{1.73_m2} (ref >59)

## 2023-04-14 LAB — LIPID PANEL
Cholesterol, Total: 197 mg/dL (ref 100–199)
HDL: 75 mg/dL (ref >39)
LDL CALC COMMENT:: 2.6 ratio (ref 0.0–5.0)
LDL Chol Calc (NIH): 108 mg/dL — ABNORMAL HIGH (ref 0–99)
Triglycerides: 76 mg/dL (ref 0–149)
VLDL Cholesterol Cal: 14 mg/dL (ref 5–40)

## 2023-04-14 LAB — PSA, TOTAL AND FREE
PSA, Free Pct: 27.3 %
PSA, Free: 0.3 ng/mL
Prostate Specific Ag, Serum: 1.1 ng/mL (ref 0.0–4.0)

## 2023-08-06 ENCOUNTER — Encounter: Payer: Self-pay | Admitting: Family Medicine

## 2023-08-26 ENCOUNTER — Telehealth: Payer: Commercial Managed Care - PPO | Admitting: Physician Assistant

## 2023-08-26 DIAGNOSIS — R0982 Postnasal drip: Secondary | ICD-10-CM | POA: Diagnosis not present

## 2023-08-26 MED ORDER — IPRATROPIUM BROMIDE 0.03 % NA SOLN
2.0000 | Freq: Two times a day (BID) | NASAL | 0 refills | Status: DC
Start: 2023-08-26 — End: 2023-12-14

## 2023-08-26 NOTE — Progress Notes (Signed)
E visit for Allergic Rhinitis We are sorry that you are not feeling well.  Here is how we plan to help!  Based on what you have shared with me it looks like you have Allergic Rhinitis.  Rhinitis is when a reaction occurs that causes nasal congestion, runny nose, sneezing, and itching.  Most types of rhinitis are caused by an inflammation and are associated with symptoms in the eyes ears or throat. There are several types of rhinitis.  The most common are acute rhinitis, which is usually caused by a viral illness, allergic or seasonal rhinitis, and nonallergic or year-round rhinitis.  Nasal allergies occur certain times of the year.  Allergic rhinitis is caused when allergens in the air trigger the release of histamine in the body.  Histamine causes itching, swelling, and fluid to build up in the fragile linings of the nasal passages, sinuses and eyelids.  An itchy nose and clear discharge are common.  I recommend the following over the counter treatments: You should take a daily dose of antihistamine, you could try Cetirizine (Zyrtec) 10mg  daily or Fexofenadine (Allegra) 180mg  daily   I also would recommend a nasal spray: A prescription nasal spray called Ipratropium Bromide nasal spray Can use 2 sprays in each nostril twice daily as needed for drainage.  This can be used together with Flonase nasal spray  HOME CARE:  You can use an over-the-counter saline nasal spray as needed Avoid areas where there is heavy dust, mites, or molds Stay indoors on windy days during the pollen season Keep windows closed in home, at least in bedroom; use air conditioner. Use high-efficiency house air filter Keep windows closed in car, turn AC on re-circulate Avoid playing out with dog during pollen season  GET HELP RIGHT AWAY IF:  If your symptoms do not improve within 10 days You become short of breath You develop yellow or green discharge from your nose for over 3 days You have coughing fits  MAKE SURE  YOU:  Understand these instructions Will watch your condition Will get help right away if you are not doing well or get worse  Thank you for choosing an e-visit. Your e-visit answers were reviewed by a board certified advanced clinical practitioner to complete your personal care plan. Depending upon the condition, your plan could have included both over the counter or prescription medications. Please review your pharmacy choice. Be sure that the pharmacy you have chosen is open so that you can pick up your prescription now.  If there is a problem you may message your provider in MyChart to have the prescription routed to another pharmacy. Your safety is important to [place]. If you have drug allergies check your prescription carefully.  For the next 24 hours, you can use MyChart to ask questions about today's visit, request a non-urgent call back, or ask for a work or school excuse from your e-visit provider. You will get an email in the next two days asking about your experience. I hope that your e-visit has been valuable and will speed your recovery.       I have spent 5 minutes in review of e-visit questionnaire, review and updating patient chart, medical decision making and response to patient.   Margaretann Loveless, PA-C

## 2023-09-02 ENCOUNTER — Encounter: Payer: Self-pay | Admitting: Family Medicine

## 2023-09-02 ENCOUNTER — Telehealth (INDEPENDENT_AMBULATORY_CARE_PROVIDER_SITE_OTHER): Payer: Commercial Managed Care - PPO | Admitting: Family Medicine

## 2023-09-02 DIAGNOSIS — J320 Chronic maxillary sinusitis: Secondary | ICD-10-CM

## 2023-09-02 MED ORDER — PREDNISONE 20 MG PO TABS
ORAL_TABLET | ORAL | 0 refills | Status: DC
Start: 2023-09-02 — End: 2024-04-20

## 2023-09-02 MED ORDER — AMOXICILLIN 500 MG PO CAPS
500.0000 mg | ORAL_CAPSULE | Freq: Two times a day (BID) | ORAL | 0 refills | Status: DC
Start: 2023-09-02 — End: 2023-12-14

## 2023-09-02 NOTE — Progress Notes (Signed)
 Virtual Visit via MyChart video note  I connected with [name] on 09/02/23 at 1217 by video and verified that I am speaking with the correct person using two identifiers. [name] is currently located at home and patient are currently with her during visit. The provider, Elige Radon Jihan Mellette, MD is located in their office at time of visit.  Call ended at 1224  I discussed the limitations, risks, security and privacy concerns of performing an evaluation and management service by video and the availability of in person appointments. I also discussed with the patient that there may be a patient responsible charge related to this service. The patient expressed understanding and agreed to proceed.   History and Present Illness: Patient has been having chronic sinus pressure since September about 5 months ago. He has tried Care[employer] adviser and nasal saline and it is not getting better.  He says it has not relieved with any of that.  He is having a lot of postnasal drainage. His voice has been feeling weak.   1. Chronic maxillary sinusitis     Outpatient Encounter Medications as of 09/02/2023  Medication Sig   amoxicillin (AMOXIL) 500 MG capsule Take 1 capsule (500 mg total) by mouth 2 (two) times daily.   predniSONE (DELTASONE) 20 MG tablet 2 po at same time daily for 5 days   co-enzyme Q-10 30 MG capsule Take 30 mg by mouth 3 (three) times daily.   ipratropium (ATROVENT) 0.03 % nasal spray Place 2 sprays into both nostrils every 12 (twelve) hours.   meloxicam (MOBIC) 15 MG tablet Take 1 tablet (15 mg total) by mouth daily.   Multiple Vitamin (MULTIVITAMIN) tablet Take 1 tablet by mouth daily.   Omega-3 Fatty Acids (FISH OIL OMEGA-3 PO) Take by mouth.   Red Yeast Rice Extract (RED YEAST RICE PO) Take by mouth.   rosuvastatin (CRESTOR) 5 MG tablet TAKE (1) TABLET ON MONDAY, WEDNESDAY AND FRIDAY AT BEDTIME   No facility-administered encounter medications on file as of 09/02/2023.     Review of Systems  Constitutional:  Negative for chills and fever.  HENT:  Positive for congestion, postnasal drip, rhinorrhea and sinus pressure. Negative for ear discharge, ear pain, sinus pain, sneezing, sore throat and voice change.   Eyes:  Negative for pain, discharge, redness and visual disturbance.  Respiratory:  Positive for cough. Negative for shortness of breath and wheezing.   Cardiovascular:  Negative for chest pain and leg swelling.  Musculoskeletal:  Negative for back pain and gait problem.  Skin:  Negative for rash.  Neurological:  Negative for headaches.  All other systems reviewed and are negative.   Observations/Objective: Patient sounds comfortable and in no acute distress  Assessment and Plan: Problem List Items Addressed This Visit   None Visit Diagnoses       Chronic maxillary sinusitis    -  Primary   Relevant Medications   amoxicillin (AMOXIL) 500 MG capsule   predniSONE (DELTASONE) 20 MG tablet       Do a short course of amoxicillin and prednisone.  He is to continue with his nasal saline sprays and let us know if it is not improving. Follow up plan: Return if symptoms worsen or fail to improve.     I discussed the assessment and treatment plan with the patient. The patient was provided an opportunity to ask questions and all were answered. The patient agreed with the plan and demonstrated an understanding of the instructions.  The patient was advised to call back or seek an in-person evaluation if the symptoms worsen or if the condition fails to improve as anticipated.  The above assessment and management plan was discussed with the patient. The patient verbalized understanding of and has agreed to the management plan. Patient is aware to call the clinic if symptoms persist or worsen. Patient is aware when to return to the clinic for a follow-up visit. Patient educated on when it is appropriate to go to the emergency department.    I provided  7 minutes of non-face-to-face time during this encounter.    Nils Pyle, MD

## 2023-12-11 ENCOUNTER — Other Ambulatory Visit: Payer: Self-pay

## 2023-12-11 ENCOUNTER — Encounter: Payer: Self-pay | Admitting: Family Medicine

## 2023-12-11 MED ORDER — MELOXICAM 15 MG PO TABS: 15.0000 mg | ORAL_TABLET | Freq: Every day | ORAL | 0 refills | Status: DC

## 2023-12-14 ENCOUNTER — Telehealth: Admitting: Physician Assistant

## 2023-12-14 DIAGNOSIS — K047 Periapical abscess without sinus: Secondary | ICD-10-CM | POA: Diagnosis not present

## 2023-12-14 MED ORDER — IBUPROFEN 600 MG PO TABS
600.0000 mg | ORAL_TABLET | Freq: Three times a day (TID) | ORAL | 0 refills | Status: DC | PRN
Start: 2023-12-14 — End: 2024-04-20

## 2023-12-14 MED ORDER — AMOXICILLIN-POT CLAVULANATE 875-125 MG PO TABS: 1.0000 | ORAL_TABLET | Freq: Two times a day (BID) | ORAL | 0 refills | Status: DC

## 2023-12-14 NOTE — Progress Notes (Signed)

## 2024-04-08 ENCOUNTER — Encounter: Payer: Commercial Managed Care - PPO | Admitting: Family Medicine

## 2024-04-13 ENCOUNTER — Encounter: Payer: Commercial Managed Care - PPO | Admitting: Family Medicine

## 2024-04-14 ENCOUNTER — Encounter: Payer: Commercial Managed Care - PPO | Admitting: Family Medicine

## 2024-04-20 ENCOUNTER — Encounter: Payer: Self-pay | Admitting: Family Medicine

## 2024-04-20 ENCOUNTER — Ambulatory Visit: Payer: Commercial Managed Care - PPO | Admitting: Family Medicine

## 2024-04-20 VITALS — BP 147/76 | HR 65 | Ht 68.0 in | Wt 174.0 lb

## 2024-04-20 DIAGNOSIS — Z0001 Encounter for general adult medical examination with abnormal findings: Secondary | ICD-10-CM | POA: Diagnosis not present

## 2024-04-20 DIAGNOSIS — Z125 Encounter for screening for malignant neoplasm of prostate: Secondary | ICD-10-CM | POA: Diagnosis not present

## 2024-04-20 DIAGNOSIS — E782 Mixed hyperlipidemia: Secondary | ICD-10-CM

## 2024-04-20 DIAGNOSIS — Z0000 Encounter for general adult medical examination without abnormal findings: Secondary | ICD-10-CM

## 2024-04-20 LAB — LIPID PANEL

## 2024-04-20 MED ORDER — MELOXICAM 15 MG PO TABS: 15.0000 mg | ORAL_TABLET | Freq: Every day | ORAL | 3 refills | Status: AC

## 2024-04-20 MED ORDER — ROSUVASTATIN CALCIUM 5 MG PO TABS
ORAL_TABLET | ORAL | 3 refills | Status: AC
Start: 2024-04-20 — End: ?

## 2024-04-20 NOTE — Progress Notes (Signed)
 BP (!) 147/76   Pulse 65   Ht 5' 8 (1.727 m)   Wt 174 lb (78.9 kg)   SpO2 98%   BMI 26.46 kg/m    Subjective:   Patient ID: [name], male    DOB: 10-12-1965, 58 y.o.   MRN: [id number]  HPI: [name] is a 58 y.o. male presenting on 04/20/2024 for No chief complaint on file.   Discussed the use of AI scribe software for clinical note transcription with the patient, who gave verbal consent to proceed.  History of Present Illness   [name] is a 58 year old male who presents for an annual physical exam.  Blood pressure fluctuations - Monitors blood pressure regularly - Initial elevations decrease by approximately 20 points, stabilizing around 120/80 mmHg - Fluctuations attributed to stress, particularly related to wife's recent job loss  Hyperlipidemia management - Continues Crestor  three times weekly  Nocturia - Nocturia occurs twice nightly - Attributes nocturia to drinking fluids late in the evening - No changes in urinary flow  Knee arthralgia - Uses meloxicam  as needed for knee arthritis - May go months without meloxicam , but uses it for three days during flare-ups  General health status - No new or unusual symptoms - Feels generally well - No significant health concerns          Relevant past medical, surgical, family and social history reviewed and updated as indicated. Interim medical history since our last visit reviewed. Allergies and medications reviewed and updated.  Review of Systems  Constitutional:  Negative for chills and fever.  HENT:  Negative for ear pain and tinnitus.   Eyes:  Negative for pain and visual disturbance.  Respiratory:  Negative for cough, shortness of breath and wheezing.   Cardiovascular:  Negative for chest pain, palpitations and leg swelling.  Gastrointestinal:  Negative for abdominal pain, blood in stool, constipation and diarrhea.  Genitourinary:  Negative for dysuria and hematuria.   Musculoskeletal:  Positive for arthralgias. Negative for back pain, gait problem and myalgias.  Skin:  Negative for rash.  Neurological:  Negative for dizziness, weakness, light-headedness and headaches.  Psychiatric/Behavioral:  Negative for suicidal ideas.   All other systems reviewed and are negative.   Per HPI unless specifically indicated above   Allergies as of 04/20/2024   No Known Allergies      Medication List        Accurate as of April 20, 2024  8:08 AM. If you have any questions, ask your nurse or doctor.          STOP taking these medications    amoxicillin -clavulanate 875-125 MG tablet Commonly known as: AUGMENTIN  Stopped by: Fonda LABOR Almedia Cordell   ibuprofen  600 MG tablet Commonly known as: ADVIL  Stopped by: Fonda LABOR Zeppelin Commisso   predniSONE  20 MG tablet Commonly known as: DELTASONE  Stopped by: Fonda LABOR Margretta Zamorano   RED YEAST RICE PO Stopped by: Fonda LABOR Chanel Mckesson       TAKE these medications    co-enzyme Q-10 30 MG capsule Take 30 mg by mouth 3 (three) times daily.   FISH OIL OMEGA-3 PO Take by mouth.   meloxicam  15 MG tablet Commonly known as: MOBIC  Take 1 tablet (15 mg total) by mouth daily.   multivitamin tablet Take 1 tablet by mouth daily.   rosuvastatin  5 MG tablet Commonly known as: CRESTOR  TAKE (1) TABLET ON MONDAY, WEDNESDAY AND FRIDAY AT BEDTIME         Objective:  BP (!) 147/76   Pulse 65   Ht 5' 8 (1.727 m)   Wt 174 lb (78.9 kg)   SpO2 98%   BMI 26.46 kg/m   Wt Readings from Last 3 Encounters:  04/20/24 174 lb (78.9 kg)  04/13/23 174 lb 4 oz (79 kg)  07/09/22 171 lb (77.6 kg)    Physical Exam Vitals reviewed.  Constitutional:      General: He is not in acute distress.    Appearance: He is well-developed. He is not diaphoretic.  HENT:     Right Ear: External ear normal.     Left Ear: External ear normal.     Nose: Nose normal.     Mouth/Throat:     Pharynx: No oropharyngeal exudate.  Eyes:      General: No scleral icterus.    Conjunctiva/sclera: Conjunctivae normal.  Neck:     Thyroid : No thyromegaly.  Cardiovascular:     Rate and Rhythm: Normal rate and regular rhythm.     Heart sounds: Normal heart sounds. No murmur heard. Pulmonary:     Effort: Pulmonary effort is normal. No respiratory distress.     Breath sounds: Normal breath sounds. No wheezing.  Abdominal:     General: Bowel sounds are normal. There is no distension.     Palpations: Abdomen is soft.     Tenderness: There is no abdominal tenderness. There is no guarding or rebound.  Genitourinary:    Prostate: Normal.     Rectum: Normal.  Musculoskeletal:        General: Normal range of motion.     Cervical back: Neck supple.  Lymphadenopathy:     Cervical: No cervical adenopathy.  Skin:    General: Skin is warm and dry.     Findings: No rash.  Neurological:     Mental Status: He is alert and oriented to person, place, and time.     Coordination: Coordination normal.  Psychiatric:        Behavior: Behavior normal.    Physical Exam   CHEST: Lungs clear to auscultation bilaterally. CARDIOVASCULAR: Heart regular rate and rhythm, no murmurs.         Assessment & Plan:   Problem List Items Addressed This Visit       Other   Mixed hyperlipidemia   Relevant Medications   rosuvastatin  (CRESTOR ) 5 MG tablet   Other Relevant Orders   CBC with Differential/Platelet   CMP14+EGFR   Lipid panel   Other Visit Diagnoses       Physical exam    -  Primary   Relevant Orders   CBC with Differential/Platelet   CMP14+EGFR   Lipid panel     Prostate cancer screening       Relevant Orders   CBC with Differential/Platelet   CMP14+EGFR   Lipid panel   PSA, total and free           Adult Wellness Visit Routine wellness visit. No significant health concerns. - Perform physical examination.  Screening for prostate cancer Discussed prostate cancer screening. Nocturia twice a night, no significant urinary  changes. Possible age-related prostate enlargement. - Perform PSA test. - Perform prostate exam. - Advise on fluid restriction and caffeine avoidance in the evening.  Nocturia Nocturia twice a night, possible age-related prostate enlargement. Flomax discussed if bothersome. - Advise on fluid restriction and caffeine avoidance in the evening. - Consider Flomax if nocturia becomes bothersome.  Mixed hyperlipidemia Continues Crestor  three times a week. No new  concerns. - Continue Crestor  three times a week.  Knee osteoarthritis Intermittent knee pain managed with meloxicam  and exercises. - Use meloxicam  as needed for flare-ups.  General Health Maintenance Discussed immunizations and preventative health. Not high risk for pneumonia or shingles. - Consider pneumonia and shingles vaccinations.        Follow up plan: Return in about 1 year (around 04/20/2025), or if symptoms worsen or fail to improve, for Physical exam recheck cholesterol.  Counseling provided for all of the vaccine components Orders Placed This Encounter  Procedures   CBC with Differential/Platelet   CMP14+EGFR   Lipid panel   PSA, total and free    Fonda Levins, MD [facility] 04/20/2024, 8:08 AM

## 2024-04-21 ENCOUNTER — Ambulatory Visit: Payer: Self-pay | Admitting: Family Medicine

## 2024-04-21 LAB — CBC WITH DIFFERENTIAL/PLATELET
Basophils Absolute: 0.1 x10E3/uL (ref 0.0–0.2)
Basos: 1 %
EOS (ABSOLUTE): 0.2 x10E3/uL (ref 0.0–0.4)
Eos: 3 %
Hematocrit: 49 % (ref 37.5–51.0)
Hemoglobin: 15.9 g/dL (ref 13.0–17.7)
Immature Grans (Abs): 0 x10E3/uL (ref 0.0–0.1)
Immature Granulocytes: 0 %
Lymphocytes Absolute: 2.4 x10E3/uL (ref 0.7–3.1)
Lymphs: 40 %
MCH: 31.5 pg (ref 26.6–33.0)
MCHC: 32.4 g/dL (ref 31.5–35.7)
MCV: 97 fL (ref 79–97)
Monocytes Absolute: 0.7 x10E3/uL (ref 0.1–0.9)
Monocytes: 11 %
Neutrophils Absolute: 2.7 x10E3/uL (ref 1.4–7.0)
Neutrophils: 45 %
Platelets: 295 x10E3/uL (ref 150–450)
RBC: 5.05 x10E6/uL (ref 4.14–5.80)
RDW: 12.8 % (ref 11.6–15.4)
WBC: 5.9 x10E3/uL (ref 3.4–10.8)

## 2024-04-21 LAB — PSA, TOTAL AND FREE
PSA, Free Pct: 22.5 %
PSA, Free: 0.27 ng/mL
Prostate Specific Ag, Serum: 1.2 ng/mL (ref 0.0–4.0)

## 2024-04-21 LAB — CMP14+EGFR
ALT: 27 IU/L (ref 0–44)
AST: 29 IU/L (ref 0–40)
Albumin: 4.6 g/dL (ref 3.8–4.9)
Alkaline Phosphatase: 85 IU/L (ref 47–123)
BUN/Creatinine Ratio: 13 (ref 9–20)
BUN: 16 mg/dL (ref 6–24)
Bilirubin Total: 0.7 mg/dL (ref 0.0–1.2)
CO2: 22 mmol/L (ref 20–29)
Calcium: 10 mg/dL (ref 8.7–10.2)
Chloride: 101 mmol/L (ref 96–106)
Creatinine, Ser: 1.19 mg/dL (ref 0.76–1.27)
Globulin, Total: 2.5 g/dL (ref 1.5–4.5)
Glucose: 110 mg/dL — AB (ref 70–99)
Potassium: 4.5 mmol/L (ref 3.5–5.2)
Sodium: 141 mmol/L (ref 134–144)
Total Protein: 7.1 g/dL (ref 6.0–8.5)
eGFR: 70 mL/min/1.73 (ref >59)

## 2024-04-21 LAB — LIPID PANEL
Cholesterol, Total: 230 mg/dL — AB (ref 100–199)
HDL: 74 mg/dL (ref >39)
LDL CALC COMMENT:: 3.1 ratio (ref 0.0–5.0)
LDL Chol Calc (NIH): 140 mg/dL — AB (ref 0–99)
Triglycerides: 91 mg/dL (ref 0–149)
VLDL Cholesterol Cal: 16 mg/dL (ref 5–40)

## 2025-04-21 ENCOUNTER — Encounter: Payer: Self-pay | Admitting: Family Medicine
